# Patient Record
Sex: Female | Born: 1945 | Marital: Married | Smoking: Never smoker
Health system: Southern US, Community
[De-identification: ages and names within clinical notes are randomized; demographics above are authoritative.]

## PROBLEM LIST (undated history)

## (undated) DIAGNOSIS — G43909 Migraine, unspecified, not intractable, without status migrainosus: Secondary | ICD-10-CM

## (undated) DIAGNOSIS — M1990 Unspecified osteoarthritis, unspecified site: Secondary | ICD-10-CM

## (undated) DIAGNOSIS — K219 Gastro-esophageal reflux disease without esophagitis: Secondary | ICD-10-CM

## (undated) DIAGNOSIS — E079 Disorder of thyroid, unspecified: Secondary | ICD-10-CM

## (undated) DIAGNOSIS — H269 Unspecified cataract: Secondary | ICD-10-CM

## (undated) DIAGNOSIS — F329 Major depressive disorder, single episode, unspecified: Secondary | ICD-10-CM

## (undated) DIAGNOSIS — K5790 Diverticulosis of intestine, part unspecified, without perforation or abscess without bleeding: Secondary | ICD-10-CM

## (undated) DIAGNOSIS — K900 Celiac disease: Secondary | ICD-10-CM

## (undated) DIAGNOSIS — Z8739 Personal history of other diseases of the musculoskeletal system and connective tissue: Secondary | ICD-10-CM

## (undated) DIAGNOSIS — D126 Benign neoplasm of colon, unspecified: Secondary | ICD-10-CM

## (undated) DIAGNOSIS — J45909 Unspecified asthma, uncomplicated: Secondary | ICD-10-CM

## (undated) DIAGNOSIS — M8580 Other specified disorders of bone density and structure, unspecified site: Secondary | ICD-10-CM

## (undated) DIAGNOSIS — M797 Fibromyalgia: Secondary | ICD-10-CM

## (undated) DIAGNOSIS — F32A Depression, unspecified: Secondary | ICD-10-CM

## (undated) DIAGNOSIS — S82892A Other fracture of left lower leg, initial encounter for closed fracture: Secondary | ICD-10-CM

## (undated) DIAGNOSIS — M5430 Sciatica, unspecified side: Secondary | ICD-10-CM

## (undated) HISTORY — DX: Benign neoplasm of colon, unspecified: D12.6

## (undated) HISTORY — DX: Major depressive disorder, single episode, unspecified: F32.9

## (undated) HISTORY — DX: Other specified disorders of bone density and structure, unspecified site: M85.80

## (undated) HISTORY — PX: COLON SURGERY: SHX602

## (undated) HISTORY — DX: Gastro-esophageal reflux disease without esophagitis: K21.9

## (undated) HISTORY — DX: Personal history of other diseases of the musculoskeletal system and connective tissue: Z87.39

## (undated) HISTORY — PX: CATARACT EXTRACTION W/ INTRAOCULAR LENS IMPLANT: SHX1309

## (undated) HISTORY — DX: Unspecified cataract: H26.9

## (undated) HISTORY — PX: TUBAL LIGATION: SHX77

## (undated) HISTORY — PX: ABDOMINAL HYSTERECTOMY: SHX81

## (undated) HISTORY — PX: FOOT SURGERY: SHX648

## (undated) HISTORY — DX: Disorder of thyroid, unspecified: E07.9

## (undated) HISTORY — DX: Fibromyalgia: M79.7

## (undated) HISTORY — DX: Migraine, unspecified, not intractable, without status migrainosus: G43.909

## (undated) HISTORY — DX: Depression, unspecified: F32.A

## (undated) HISTORY — DX: Diverticulosis of intestine, part unspecified, without perforation or abscess without bleeding: K57.90

## (undated) HISTORY — PX: KNEE SURGERY: SHX244

## (undated) HISTORY — DX: Celiac disease: K90.0

## (undated) HISTORY — DX: Sciatica, unspecified side: M54.30

## (undated) HISTORY — DX: Unspecified asthma, uncomplicated: J45.909

## (undated) HISTORY — DX: Unspecified osteoarthritis, unspecified site: M19.90

## (undated) HISTORY — DX: Other fracture of left lower leg, initial encounter for closed fracture: S82.892A

---

## 1987-05-02 HISTORY — PX: BREAST EXCISIONAL BIOPSY: SUR124

## 2012-05-01 DIAGNOSIS — S82892A Other fracture of left lower leg, initial encounter for closed fracture: Secondary | ICD-10-CM

## 2012-05-01 HISTORY — DX: Other fracture of left lower leg, initial encounter for closed fracture: S82.892A

## 2012-08-08 DIAGNOSIS — K219 Gastro-esophageal reflux disease without esophagitis: Secondary | ICD-10-CM | POA: Insufficient documentation

## 2012-08-08 DIAGNOSIS — M5430 Sciatica, unspecified side: Secondary | ICD-10-CM | POA: Insufficient documentation

## 2012-08-08 DIAGNOSIS — K900 Celiac disease: Secondary | ICD-10-CM | POA: Insufficient documentation

## 2012-08-08 DIAGNOSIS — K5790 Diverticulosis of intestine, part unspecified, without perforation or abscess without bleeding: Secondary | ICD-10-CM | POA: Insufficient documentation

## 2012-08-08 DIAGNOSIS — J4540 Moderate persistent asthma, uncomplicated: Secondary | ICD-10-CM | POA: Insufficient documentation

## 2012-08-08 HISTORY — DX: Celiac disease: K90.0

## 2012-10-23 DIAGNOSIS — M1990 Unspecified osteoarthritis, unspecified site: Secondary | ICD-10-CM | POA: Insufficient documentation

## 2012-11-06 DIAGNOSIS — M5136 Other intervertebral disc degeneration, lumbar region: Secondary | ICD-10-CM | POA: Insufficient documentation

## 2012-12-01 ENCOUNTER — Other Ambulatory Visit: Payer: Self-pay | Admitting: Neurosurgery

## 2012-12-01 DIAGNOSIS — M549 Dorsalgia, unspecified: Secondary | ICD-10-CM

## 2012-12-05 ENCOUNTER — Ambulatory Visit
Admission: RE | Admit: 2012-12-05 | Discharge: 2012-12-05 | Disposition: A | Discharge status: Home / Self Care | Payer: Medicare Other | Source: Ambulatory Visit | Attending: Neurosurgery | Admitting: Neurosurgery

## 2012-12-05 VITALS — BP 103/60 | HR 59

## 2012-12-05 DIAGNOSIS — M549 Dorsalgia, unspecified: Secondary | ICD-10-CM

## 2012-12-05 MED ORDER — MEPERIDINE HCL 100 MG/ML IJ SOLN
75.0000 mg | Freq: Once | INTRAMUSCULAR | Status: AC
  Administered 2012-12-05: 75 mg via INTRAMUSCULAR

## 2012-12-05 MED ORDER — DIAZEPAM 5 MG PO TABS
10.0000 mg | ORAL_TABLET | Freq: Once | ORAL | Status: AC
  Administered 2012-12-05: 5 mg via ORAL

## 2012-12-05 MED ORDER — ONDANSETRON HCL 4 MG/2ML IJ SOLN
4.0000 mg | Freq: Once | INTRAMUSCULAR | Status: AC
  Administered 2012-12-05: 4 mg via INTRAMUSCULAR

## 2012-12-05 MED ORDER — IOHEXOL 180 MG/ML  SOLN
18.0000 mL | Freq: Once | INTRAMUSCULAR | Status: AC | PRN
  Administered 2012-12-05: 20 mL via INTRATHECAL

## 2012-12-05 NOTE — Progress Notes (Addendum)
Through husband translating, patient states she has been off Tramadol and Trazodone for the past two days.  Husband helped explain discharge instructions to patient.  Questions answered.  jkl

## 2013-06-23 ENCOUNTER — Other Ambulatory Visit: Payer: Self-pay | Admitting: Family Medicine

## 2013-06-23 DIAGNOSIS — M1990 Unspecified osteoarthritis, unspecified site: Secondary | ICD-10-CM

## 2013-06-23 DIAGNOSIS — Z1231 Encounter for screening mammogram for malignant neoplasm of breast: Secondary | ICD-10-CM

## 2013-06-24 DIAGNOSIS — E039 Hypothyroidism, unspecified: Secondary | ICD-10-CM | POA: Insufficient documentation

## 2013-07-10 ENCOUNTER — Ambulatory Visit
Admission: RE | Admit: 2013-07-10 | Discharge: 2013-07-10 | Disposition: A | Discharge status: Home / Self Care | Payer: Medicare HMO | Source: Ambulatory Visit | Attending: Family Medicine | Admitting: Family Medicine

## 2013-07-10 DIAGNOSIS — Z1231 Encounter for screening mammogram for malignant neoplasm of breast: Secondary | ICD-10-CM

## 2013-07-10 DIAGNOSIS — M1990 Unspecified osteoarthritis, unspecified site: Secondary | ICD-10-CM

## 2013-11-21 DIAGNOSIS — M8580 Other specified disorders of bone density and structure, unspecified site: Secondary | ICD-10-CM | POA: Insufficient documentation

## 2014-09-10 ENCOUNTER — Other Ambulatory Visit: Payer: Self-pay | Admitting: Family Medicine

## 2014-09-10 DIAGNOSIS — Z1231 Encounter for screening mammogram for malignant neoplasm of breast: Secondary | ICD-10-CM

## 2014-11-04 ENCOUNTER — Ambulatory Visit: Payer: Medicare HMO

## 2014-11-11 ENCOUNTER — Ambulatory Visit
Admission: RE | Admit: 2014-11-11 | Discharge: 2014-11-11 | Disposition: A | Discharge status: Home / Self Care | Payer: Medicare HMO | Source: Ambulatory Visit | Attending: Family Medicine | Admitting: Family Medicine

## 2014-11-11 DIAGNOSIS — Z1231 Encounter for screening mammogram for malignant neoplasm of breast: Secondary | ICD-10-CM

## 2014-11-24 ENCOUNTER — Other Ambulatory Visit: Payer: Self-pay | Admitting: Family Medicine

## 2014-11-24 DIAGNOSIS — N644 Mastodynia: Secondary | ICD-10-CM

## 2014-11-30 ENCOUNTER — Ambulatory Visit
Admission: RE | Admit: 2014-11-30 | Discharge: 2014-11-30 | Disposition: A | Discharge status: Home / Self Care | Payer: Medicare HMO | Source: Ambulatory Visit | Attending: Family Medicine | Admitting: Family Medicine

## 2014-11-30 DIAGNOSIS — N644 Mastodynia: Secondary | ICD-10-CM

## 2014-12-02 DIAGNOSIS — F339 Major depressive disorder, recurrent, unspecified: Secondary | ICD-10-CM | POA: Insufficient documentation

## 2014-12-02 DIAGNOSIS — M797 Fibromyalgia: Secondary | ICD-10-CM | POA: Insufficient documentation

## 2014-12-02 DIAGNOSIS — S82892A Other fracture of left lower leg, initial encounter for closed fracture: Secondary | ICD-10-CM | POA: Insufficient documentation

## 2015-05-06 ENCOUNTER — Other Ambulatory Visit: Payer: Self-pay | Admitting: Family Medicine

## 2015-05-06 DIAGNOSIS — N6310 Unspecified lump in the right breast, unspecified quadrant: Secondary | ICD-10-CM

## 2015-05-18 ENCOUNTER — Ambulatory Visit
Admission: RE | Admit: 2015-05-18 | Discharge: 2015-05-18 | Disposition: A | Discharge status: Home / Self Care | Payer: Medicare HMO | Source: Ambulatory Visit | Attending: Family Medicine | Admitting: Family Medicine

## 2015-05-18 DIAGNOSIS — N6310 Unspecified lump in the right breast, unspecified quadrant: Secondary | ICD-10-CM

## 2015-08-02 DIAGNOSIS — D126 Benign neoplasm of colon, unspecified: Secondary | ICD-10-CM

## 2015-08-02 HISTORY — DX: Benign neoplasm of colon, unspecified: D12.6

## 2015-10-05 LAB — HM HEPATITIS C SCREENING LAB: HM HEPATITIS C SCREENING: NEGATIVE

## 2015-11-01 ENCOUNTER — Other Ambulatory Visit: Payer: Self-pay | Admitting: Family Medicine

## 2015-11-01 DIAGNOSIS — Z1231 Encounter for screening mammogram for malignant neoplasm of breast: Secondary | ICD-10-CM

## 2015-11-12 ENCOUNTER — Other Ambulatory Visit: Payer: Self-pay | Admitting: Family Medicine

## 2015-11-12 DIAGNOSIS — Z8739 Personal history of other diseases of the musculoskeletal system and connective tissue: Secondary | ICD-10-CM | POA: Insufficient documentation

## 2015-11-12 DIAGNOSIS — M8580 Other specified disorders of bone density and structure, unspecified site: Secondary | ICD-10-CM

## 2015-11-16 ENCOUNTER — Ambulatory Visit: Payer: Medicare HMO

## 2015-11-26 ENCOUNTER — Other Ambulatory Visit: Payer: Medicare HMO

## 2015-11-26 ENCOUNTER — Ambulatory Visit: Payer: Medicare HMO

## 2015-12-01 ENCOUNTER — Ambulatory Visit: Payer: Medicare HMO

## 2015-12-01 ENCOUNTER — Other Ambulatory Visit: Payer: Medicare HMO

## 2015-12-13 ENCOUNTER — Ambulatory Visit
Admission: RE | Admit: 2015-12-13 | Discharge: 2015-12-13 | Disposition: A | Discharge status: Home / Self Care | Payer: Medicare HMO | Source: Ambulatory Visit | Attending: Family Medicine | Admitting: Family Medicine

## 2015-12-13 DIAGNOSIS — Z1231 Encounter for screening mammogram for malignant neoplasm of breast: Secondary | ICD-10-CM

## 2015-12-13 DIAGNOSIS — M8580 Other specified disorders of bone density and structure, unspecified site: Secondary | ICD-10-CM

## 2017-01-16 ENCOUNTER — Other Ambulatory Visit: Payer: Self-pay | Admitting: Family Medicine

## 2017-01-16 ENCOUNTER — Other Ambulatory Visit: Payer: Self-pay | Admitting: Obstetrics and Gynecology

## 2017-01-16 DIAGNOSIS — Z1231 Encounter for screening mammogram for malignant neoplasm of breast: Secondary | ICD-10-CM

## 2017-01-29 ENCOUNTER — Ambulatory Visit
Admission: RE | Admit: 2017-01-29 | Discharge: 2017-01-29 | Disposition: A | Discharge status: Home / Self Care | Payer: Medicare HMO | Source: Ambulatory Visit | Attending: Obstetrics and Gynecology | Admitting: Obstetrics and Gynecology

## 2017-01-29 DIAGNOSIS — Z1231 Encounter for screening mammogram for malignant neoplasm of breast: Secondary | ICD-10-CM

## 2017-04-04 ENCOUNTER — Encounter: Payer: Self-pay | Admitting: *Deleted

## 2017-04-04 ENCOUNTER — Other Ambulatory Visit: Payer: Self-pay | Admitting: *Deleted

## 2017-04-04 NOTE — Progress Notes (Addendum)
Subjective:   Allison Grant is a 71 y.o. female who presents for Medicare Annual (Subsequent) preventive examination.  [facility] Interpreter Service representative Vaughan Browner here as interpreter.   Review of Systems:  No ROS.  Medicare Wellness Visit. Additional risk factors are reflected in the social history.  Cardiac Risk Factors include: advanced age (>43mn, >>47women);family history of premature cardiovascular disease;sedentary lifestyle;obesity (BMI >30kg/m2)   Sleep patterns: Sleeps 6 hours. Struggles with pain getting out of bed.  Home Safety/Smoke Alarms: Feels safe in home. Smoke alarms in place.  Living environment; residence and Firearm Safety: Lives with husband and grandson in first floor apt.  Seat Belt Safety/Bike Helmet: Wears seat belt.   Female:   Pap-N/A       Mammo-01/29/2017, negative.      Dexa scan-12/13/2015, Osteopenia.    CCS-Colonoscopy 06/2015, recall 10 years.      Objective:     Vitals: BP 110/74 (BP Location: Right Arm, Patient Position: Sitting, Cuff Size: Large)   Pulse 72   Temp 98.1 F (36.7 C) (Oral)   Ht 5' 2"  (1.575 m)   Wt 155 lb 4 oz (70.4 kg)   SpO2 98%   BMI 28.40 kg/m   Body mass index is 28.4 kg/m.  Advanced Directives 04/05/2017  Does Patient Have a Medical Advance Directive? No  Would patient like information on creating a medical advance directive? Yes (MAU/Ambulatory/Procedural Areas - Information given)    Tobacco Social History   Tobacco Use  Smoking Status Never Smoker  Smokeless Tobacco Never Used     Counseling given: Not Answered     Past Medical History:  Diagnosis Date  . Arthritis   . Asthma   . Cataracts, bilateral   . 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 Depression   . Disease of thyroid gland   . Diverticulosis   . Fibromyalgia   . Fracture of left ankle 05/2012  . GERD (gastroesophageal reflux disease)   .  Migraine   . Osteopenia   . Personal history of calcium pyrophosphate deposition disease (CPPD)   . Sciatica    Past Surgical History:  Procedure Laterality Date  . ABDOMINAL HYSTERECTOMY    . BREAST EXCISIONAL BIOPSY Left 1989   benign  . CATARACT EXTRACTION W/ INTRAOCULAR LENS IMPLANT Left   . FOOT SURGERY    . TUBAL LIGATION     Family History  Problem Relation Age of Onset  . Arthritis Mother   . Asthma Mother   . Heart disease Mother   . Hypertension Mother   . Mental illness Mother   . Thyroid disease Mother   . Emphysema Father   . Hyperlipidemia Father   . Asthma Sister   . Seizures Sister   . Cancer Paternal Grandmother   . Diabetes Paternal Grandmother   . Hypertension Paternal Grandmother    Social History   Socioeconomic History  . Marital status: Married    Spouse name: None  . Number of children: None  . Years of education: None  . Highest education level: None  Social Needs  . Financial resource strain: None  . Food insecurity - worry: None  . Food insecurity - inability: None  . Transportation needs - medical: None  . Transportation needs - non-medical: None  Occupational History  . None  Tobacco Use  . Smoking status: Never Smoker  . Smokeless tobacco: Never  Used  Substance and Sexual Activity  . Alcohol use: No    Frequency: Never  . Drug use: No  . Sexual activity: No  Other Topics Concern  . None  Social History Narrative  . None    Outpatient Encounter Medications as of 04/05/2017  Medication Sig  . Acetaminophen 500 MG coapsule   . clonazePAM (KLONOPIN) 1 MG tablet TAKE 1 TABLET TWICE DAILY AS NEEDED FOR ANXIETY  . dicyclomine (BENTYL) 10 MG capsule Take by mouth.  . lansoprazole (PREVACID) 30 MG capsule Take by mouth.  . levothyroxine (SYNTHROID, LEVOTHROID) 25 MCG tablet Take by mouth.  . oxyCODONE-acetaminophen (PERCOCET/ROXICET) 5-325 MG tablet Take 1 tablet by mouth daily as needed for severe pain.  . Polyethylene Glycol  3350 GRAN by Does not apply route.  . traZODone (DESYREL) 100 MG tablet TAKE 2 TABLETS AT BEDTIME  . [DISCONTINUED] clonazePAM (KLONOPIN) 1 MG tablet TAKE 1 TABLET TWICE DAILY AS NEEDED FOR ANXIETY  . [DISCONTINUED] oxyCODONE-acetaminophen (PERCOCET/ROXICET) 5-325 MG tablet Take by mouth.  . [DISCONTINUED] montelukast (SINGULAIR) 10 MG tablet Take by mouth.  . [DISCONTINUED] pregabalin (LYRICA) 150 MG capsule Take by mouth.  . [DISCONTINUED] traMADol-acetaminophen (ULTRACET) 37.5-325 MG tablet Take by mouth.  . [DISCONTINUED] venlafaxine XR (EFFEXOR-XR) 75 MG 24 hr capsule TAKE ONE CAPSULE BY MOUTH EVERY MORNING WITH BREAKFAST   No facility-administered encounter medications on file as of 04/05/2017.       Patient Care Team: Leamon Arnt, MD as PCP - General (Family Medicine) [place], [place] (Specialist)    Assessment:    Physical assessment deferred to PCP.  Exercise Activities and Dietary recommendations Current Exercise Habits: The patient does not participate in regular exercise at present, Exercise limited by: orthopedic condition(s)   Diet (meal preparation, eat out, water intake, caffeinated beverages, dairy products, fruits and vegetables): Drinks water and juice.   Breakfast: coffee only Eats plenty of fruits and vegetables.   Goals    None     Fall Risk Fall Risk  04/05/2017 04/05/2017  Falls in the past year? No No    Depression Screen PHQ 2/9 Scores 04/05/2017  PHQ - 2 Score 0  PHQ- 9 Score 4     Cognitive Function       Ad8 score reviewed for issues:  Issues making decisions: no  Less interest in hobbies / activities: no  Repeats questions, stories (family complaining): no  Trouble using ordinary gadgets (microwave, computer, phone): no  Forgets the month or year: no  Mismanaging finances: no  Remembering appts: no  Daily problems with thinking and/or memory: no Ad8 score is=0     Immunization History  Administered  Date(s) Administered  . Influenza, High Dose Seasonal PF 01/28/2014, 05/11/2015, 03/31/2016, 01/30/2017  . Influenza, Quadrivalent, Recombinant, Inj, Pf 03/24/2013  . Pneumococcal Conjugate-13 09/10/2014  . Pneumococcal Polysaccharide-23 08/08/2012  . Zoster 09/10/2014   Screening Tests Health Maintenance  Topic Date Due  . TETANUS/TDAP  04/05/2018 (Originally 04/11/1965)  . PNA vac Low Risk Adult (2 of 2 - PPSV23) 08/08/2017  . MAMMOGRAM  01/29/2018  . COLONOSCOPY  07/28/2025  . INFLUENZA VACCINE  Completed  . DEXA SCAN  Completed  . Hepatitis C Screening  Completed       Plan:    Bring a copy of your living will and/or healthcare power of attorney to your next office visit.  Continue doing brain stimulating activities (puzzles, reading, adult coloring books, staying active) to keep memory sharp.   I have personally  reviewed and noted the following in the patient's chart:   . Medical and social history . Use of alcohol, tobacco or illicit drugs  . Current medications and supplements . Functional ability and status . Nutritional status . Physical activity . Advanced directives . List of other physicians . Hospitalizations, surgeries, and ER visits in previous 12 months . Vitals . Screenings to include cognitive, depression, and falls . Referrals and appointments  In addition, I have reviewed and discussed with patient certain preventive protocols, quality metrics, and best practice recommendations. A written personalized care plan for preventive services as well as general preventive health recommendations were provided to patient.     Gerilyn Nestle, RN  04/05/2017   I have reviewed the above documentation regarding the annual wellness visit and agree with the documentation, impression and recommendations.  Billey Chang, MD

## 2017-04-05 ENCOUNTER — Encounter: Payer: Self-pay | Admitting: *Deleted

## 2017-04-05 ENCOUNTER — Encounter: Payer: Self-pay | Admitting: Family Medicine

## 2017-04-05 ENCOUNTER — Other Ambulatory Visit: Payer: Self-pay | Admitting: *Deleted

## 2017-04-05 ENCOUNTER — Ambulatory Visit: Payer: Medicare HMO

## 2017-04-05 ENCOUNTER — Ambulatory Visit (INDEPENDENT_AMBULATORY_CARE_PROVIDER_SITE_OTHER): Payer: Medicare HMO | Admitting: Family Medicine

## 2017-04-05 VITALS — BP 110/74 | HR 72 | Temp 98.1°F | Ht 62.0 in | Wt 155.2 lb

## 2017-04-05 DIAGNOSIS — F339 Major depressive disorder, recurrent, unspecified: Secondary | ICD-10-CM

## 2017-04-05 DIAGNOSIS — M797 Fibromyalgia: Secondary | ICD-10-CM | POA: Diagnosis not present

## 2017-04-05 DIAGNOSIS — M5136 Other intervertebral disc degeneration, lumbar region: Secondary | ICD-10-CM

## 2017-04-05 DIAGNOSIS — Z0000 Encounter for general adult medical examination without abnormal findings: Secondary | ICD-10-CM | POA: Diagnosis not present

## 2017-04-05 DIAGNOSIS — E039 Hypothyroidism, unspecified: Secondary | ICD-10-CM | POA: Diagnosis not present

## 2017-04-05 DIAGNOSIS — F1190 Opioid use, unspecified, uncomplicated: Secondary | ICD-10-CM | POA: Diagnosis not present

## 2017-04-05 DIAGNOSIS — K219 Gastro-esophageal reflux disease without esophagitis: Secondary | ICD-10-CM

## 2017-04-05 DIAGNOSIS — G894 Chronic pain syndrome: Secondary | ICD-10-CM

## 2017-04-05 LAB — LIPID PANEL
CHOL/HDL RATIO: 4
Cholesterol: 240 mg/dL — ABNORMAL HIGH (ref 0–200)
HDL: 61.9 mg/dL (ref >39.00)
LDL Cholesterol: 160 mg/dL — ABNORMAL HIGH (ref 0–99)
NonHDL: 178.22
TRIGLYCERIDES: 92 mg/dL (ref 0.0–149.0)
VLDL: 18.4 mg/dL (ref 0.0–40.0)

## 2017-04-05 LAB — CBC WITH DIFFERENTIAL/PLATELET
BASOS ABS: 0 10*3/uL (ref 0.0–0.1)
BASOS PCT: 0.5 % (ref 0.0–3.0)
EOS PCT: 1.6 % (ref 0.0–5.0)
Eosinophils Absolute: 0.1 10*3/uL (ref 0.0–0.7)
HEMATOCRIT: 42.7 % (ref 36.0–46.0)
Hemoglobin: 14 g/dL (ref 12.0–15.0)
LYMPHS PCT: 25.5 % (ref 12.0–46.0)
Lymphs Abs: 1.5 10*3/uL (ref 0.7–4.0)
MCHC: 32.8 g/dL (ref 30.0–36.0)
MCV: 90.7 fl (ref 78.0–100.0)
MONOS PCT: 8 % (ref 3.0–12.0)
Monocytes Absolute: 0.5 10*3/uL (ref 0.1–1.0)
NEUTROS ABS: 3.7 10*3/uL (ref 1.4–7.7)
Neutrophils Relative %: 64.4 % (ref 43.0–77.0)
PLATELETS: 264 10*3/uL (ref 150.0–400.0)
RBC: 4.71 Mil/uL (ref 3.87–5.11)
RDW: 14 % (ref 11.5–15.5)
WBC: 5.8 10*3/uL (ref 4.0–10.5)

## 2017-04-05 LAB — COMPREHENSIVE METABOLIC PANEL
ALT: 11 U/L (ref 0–35)
AST: 15 U/L (ref 0–37)
Albumin: 4.2 g/dL (ref 3.5–5.2)
Alkaline Phosphatase: 68 U/L (ref 39–117)
BILIRUBIN TOTAL: 0.3 mg/dL (ref 0.2–1.2)
BUN: 11 mg/dL (ref 6–23)
CALCIUM: 9.2 mg/dL (ref 8.4–10.5)
CO2: 29 meq/L (ref 19–32)
Chloride: 103 mEq/L (ref 96–112)
Creatinine, Ser: 0.69 mg/dL (ref 0.40–1.20)
GFR: 89.66 mL/min (ref >60.00)
Glucose, Bld: 78 mg/dL (ref 70–99)
Potassium: 4.7 mEq/L (ref 3.5–5.1)
Sodium: 140 mEq/L (ref 135–145)
Total Protein: 6.7 g/dL (ref 6.0–8.3)

## 2017-04-05 LAB — TSH: TSH: 1.23 u[IU]/mL (ref 0.35–4.50)

## 2017-04-05 MED ORDER — CLONAZEPAM 1 MG PO TABS: ORAL_TABLET | ORAL | 0 refills | Status: DC

## 2017-04-05 MED ORDER — OXYCODONE-ACETAMINOPHEN 5-325 MG PO TABS: 1.0000 | ORAL_TABLET | Freq: Every day | ORAL | 0 refills | Status: DC | PRN

## 2017-04-05 NOTE — Patient Instructions (Addendum)
It was so good seeing you again! Thank you for establishing with my new practice and allowing me to continue caring for you. It means a lot to me.   Please schedule a follow up appointment with me in 4 weeks to discuss pain medications.  Please do these things to maintain good health!   Exercise at least 30-45 minutes a day,  4-5 days a week.   Eat a low-fat diet with lots of fruits and vegetables, up to 7-9 servings per day.  Drink plenty of water daily. Try to drink 8 8oz glasses per day.  Seatbelts can save your life. Always wear your seatbelt.  Place Smoke Detectors on every level of your home and check batteries every year.  Schedule an appointment with an eye doctor for an eye exam every 1-2 years  Safe sex - use condoms to protect yourself from STDs if you could be exposed to these types of infections. Use birth control if you do not want to become pregnant and are sexually active.  Avoid heavy alcohol use. If you drink, keep it to less than 2 drinks/day and not every day.  Health Care Power of Attorney.  Choose someone you trust that could speak for you if you became unable to speak for yourself.  Depression is common in our stressful world.If you're feeling down or losing interest in things you normally enjoy, please come in for a visit.  If anyone is threatening or hurting you, please get help. Physical or Emotional Violence is never OK.   Bring a copy of your living will and/or healthcare power of attorney to your next office visit.  Continue doing brain stimulating activities (puzzles, reading, adult coloring books, staying active) to keep memory sharp.   Mantenimiento de la salud - Mujeres (Health Maintenance, Female) Un estilo de vida saludable y los cuidados preventivos pueden favorecer considerablemente a la salud y el bienestar. Pregunte a su mdico cul es el cronograma de exmenes peridicos apropiado para usted. Esta es una buena oportunidad para consultarlo  sobre cmo prevenir enfermedades y mantenerse sano. Adems de los controles, hay muchas otras cosas que puede hacer usted mismo. Los expertos han realizado numerosas investigaciones sobre los cambios en el estilo de vida y las medidas de prevencin que, muy probablemente, lo ayudarn a mantenerse sano. Solicite a su mdico ms informacin. EL PESO Y LA DIETA Consuma una dieta saludable.  Asegrese de incluir muchas verduras, frutas, productos lcteos de bajo contenido de grasa y protenas magras.  No consuma muchos alimentos de alto contenido de grasas slidas, azcares agregados o sal.  Realice actividad fsica con regularidad. Esta es una de las prcticas ms importantes que puede hacer por su salud. ? La mayora de los adultos deben hacer ejercicio durante al menos 150minutos por semana. El ejercicio debe aumentar la frecuencia cardaca y provocar la transpiracin (ejercicio de intensidad moderada). ? La mayora de los adultos tambin deben hacer ejercicios de elongacin al menos dos veces a la semana. Agregue esto al su plan de ejercicio de intensidad moderada. Mantenga un peso saludable.  El ndice de masa corporal (IMC) es una medida que puede utilizarse para identificar posibles problemas de peso. Proporciona una estimacin de la grasa corporal basndose en el peso y la altura. Su mdico puede ayudarle a determinar su IMC y a lograr o mantener un peso saludable.  Para las mujeres de 20aos o ms: ? Un IMC menor de 18,5 se considera bajo peso. ? Un IMC entre 18,5 y   24,9 es normal. ? Un IMC entre 25 y 29,9 se considera sobrepeso. ? Un IMC de 30 o ms se considera obesidad. Observe los niveles de colesterol y lpidos en la sangre.  Debe comenzar a realizarse anlisis de lpidos y colesterol en la sangre a los 20aos y luego repetirlos cada 5aos.  Es posible que necesite controlar los niveles de colesterol con mayor frecuencia si: ? Sus niveles de lpidos y colesterol son  altos. ? Es mayor de 50aos. ? Presenta un alto riesgo de padecer enfermedades cardacas. DETECCIN DE CNCER Cncer de pulmn  Se recomienda realizar exmenes de deteccin de cncer de pulmn a personas adultas entre 55 y 80 aos que estn en riesgo de desarrollar cncer de pulmn por sus antecedentes de consumo de tabaco.  Se recomienda una tomografa computarizada de baja dosis de los pulmones todos los aos a las personas que: ? Fuman actualmente. ? Hayan dejado el hbito en algn momento en los ltimos 15aos. ? Hayan fumado durante 30aos un paquete diario. Un paquete-ao equivale a fumar un promedio de un paquete de cigarrillos diario durante un ao.  Los exmenes de deteccin anuales deben continuar hasta que hayan pasado 15aos desde que dej de fumar.  Ya no debern realizarse si tiene un problema de salud que le impida recibir tratamiento para el cncer de pulmn. Cncer de mama  Practique la autoconciencia de la mama. Esto significa reconocer la apariencia normal de sus mamas y cmo las siente.  Tambin significa realizar autoexmenes regulares de las mamas. Informe a su mdico sobre cualquier cambio, sin importar cun pequeo sea.  Si tiene entre 20 y 30 aos, un mdico debe realizarle un examen clnico de las mamas como parte del examen regular de salud, cada 1 a 3aos.  Si tiene 40aos o ms, debe realizarse un examen clnico de las mamas todos los aos. Tambin considere realizarse una radiografa de las mamas (mamografa) todos los aos.  Si tiene antecedentes familiares de cncer de mama, hable con su mdico para someterse a un estudio gentico.  Si tiene alto riesgo de padecer cncer de mama, hable con su mdico para someterse a una resonancia magntica y una mamografa todos los aos.  La evaluacin del gen del cncer de mama (BRCA) se recomienda a mujeres que tengan familiares con cnceres relacionados con el BRCA. Los cnceres relacionados con el BRCA  incluyen los siguientes: ? Mama. ? Ovario. ? Trompas. ? Cnceres de peritoneo.  Los resultados de la evaluacin determinarn la necesidad de asesoramiento gentico y de anlisis de BRCA1 y BRCA2. Cncer de cuello del tero El mdico puede recomendarle que se haga pruebas peridicas de deteccin de cncer de los rganos de la pelvis (ovarios, tero y vagina). Estas pruebas incluyen un examen plvico, que abarca controlar si se produjeron cambios microscpicos en la superficie del cuello del tero (prueba de Papanicolaou). Pueden recomendarle que se haga estas pruebas cada 3aos, a partir de los 21aos.  A las mujeres que tienen entre 30 y 65aos, los mdicos pueden recomendarles que se sometan a exmenes plvicos y pruebas de Papanicolaou cada 3aos, o a la prueba de Papanicolaou y el examen plvico en combinacin con estudios de deteccin del virus del papiloma humano (VPH) cada 5aos. Algunos tipos de VPH aumentan el riesgo de padecer cncer de cuello del tero. La prueba para la deteccin del VPH tambin puede realizarse a mujeres de cualquier edad cuyos resultados de la prueba de Papanicolaou no sean claros.  Es posible que otros mdicos   no recomienden exmenes de deteccin a mujeres no embarazadas que se consideran sujetos de bajo riesgo de Chief Financial Officer de pelvis y que no tienen sntomas. Pregntele al mdico si un examen plvico de deteccin es adecuado para usted.  Si ha recibido un tratamiento para Science writer cervical o una enfermedad que podra causar cncer, necesitar realizarse una prueba de Papanicolaou y controles durante al menos 71 aos de concluido el [place]. Si no se ha hecho el Papanicolaou con regularidad, debern volver a evaluarse los factores de riesgo (como tener un nuevo compaero sexual), para Teacher, adult education si debe realizarse los estudios nuevamente. Algunas mujeres sufren problemas mdicos que aumentan la probabilidad de Museum/gallery curator cncer de cuello del tero. En estos  casos, el mdico podr [employer] se realicen controles y pruebas de Papanicolaou con ms frecuencia. Cncer colorrectal  Este tipo de cncer puede detectarse y a menudo prevenirse.  Por lo general, los estudios de rutina se deben Medical laboratory scientific officer a Field seismologist a Proofreader de los 83 aos y [place] 34 aos.  Sin embargo, el mdico podr aconsejarle que lo haga antes, si tiene factores de riesgo para el cncer de colon.  Tambin puede recomendarle que use un kit de prueba para Hydrologist en la materia fecal.  Es posible que se use una pequea cmara en el extremo de un tubo para examinar directamente el colon (sigmoidoscopia o colonoscopia) a fin de Hydrographic surveyor formas tempranas de cncer colorrectal.  Los exmenes de rutina generalmente comienzan a los 52aos.  El examen directo del colon se debe repetir cada 5 a 10aos hasta los 75aos. Sin embargo, es posible que se realicen exmenes con mayor frecuencia, si se detectan formas tempranas de plipos precancerosos o pequeos bultos. Cncer de piel  Revise la piel de la cabeza a los pies con regularidad.  Informe a su mdico si aparecen nuevos lunares o los que tiene se modifican, especialmente en su forma y color.  Tambin notifique al mdico si tiene un lunar que es ms grande que el tamao de una goma de lpiz.  Siempre use pantalla solar. Aplique pantalla solar de [place] y repetida a lo largo del Training and development officer.  Protjase usando mangas y [employer], un sombrero de ala ancha y gafas para el sol, siempre que se encuentre en el exterior. ENFERMEDADES CARDACAS, DIABETES E HIPERTENSIN ARTERIAL  La hipertensin arterial causa enfermedades cardacas y [place] el riesgo de ictus. La hipertensin arterial es ms probable en los siguientes casos: ? Las personas que tienen la presin arterial en el extremo del rango normal (100-139/85-89 mm Hg). ? [employer] con sobrepeso u obesidad. ? Scientist, water quality.  Si usted tiene entre 18 y  39 aos, debe medirse la presin arterial cada 3 a 5 aos. Si usted tiene 40 aos o ms, debe medirse la presin arterial [employer]. Debe medirse la presin arterial dos veces: una vez cuando est en un hospital o una clnica y la otra vez cuando est en otro sitio. Registre el promedio de [employer]. Para controlar su presin arterial cuando no est en un hospital o [place], puede usar lo siguiente: ? Jorje Guild automtica para medir la presin arterial en una farmacia. ? Un monitor para medir la presin arterial en el hogar.  Si tiene entre 62 y 91 aos, consulte a su mdico si debe tomar aspirina para prevenir el ictus.  Realcese exmenes de deteccin de la diabetes con regularidad. Esto incluye la toma de [place] de [place] para  controlar el nivel de azcar en la sangre durante el ayuno. ? Si tiene un peso normal y un bajo riesgo de padecer diabetes, realcese este anlisis cada tres aos despus de los 45aos. ? Si tiene sobrepeso y un alto riesgo de padecer diabetes, considere someterse a este anlisis antes o con mayor frecuencia. PREVENCIN DE INFECCIONES HepatitisB  Si tiene un riesgo ms alto de contraer hepatitis B, debe someterse a un examen de deteccin de este virus. Se considera que tiene un alto riesgo de contraer hepatitis B si: ? Naci en un pas donde la hepatitis B es frecuente. Pregntele a su mdico qu pases son considerados de alto riesgo. ? Sus padres nacieron en un pas de alto riesgo y usted no recibi una vacuna que lo proteja contra la hepatitis B (vacuna contra la hepatitis B). ? Tiene VIH o sida. ? Usa agujas para inyectarse drogas. ? Vive con alguien que tiene hepatitis B. ? Ha tenido sexo con alguien que tiene hepatitis B. ? Recibe tratamiento de hemodilisis. ? Toma ciertos medicamentos para el cncer, trasplante de rganos y afecciones autoinmunitarias. Hepatitis C  Se recomienda un anlisis de sangre para: ? Todos los que nacieron  entre 1945 y 1965. ? Todas las personas que tengan un riesgo de haber contrado hepatitis C. Enfermedades de transmisin sexual (ETS).  Debe realizarse pruebas de deteccin de enfermedades de transmisin sexual (ETS), incluidas gonorrea y clamidia si: ? Es sexualmente activo y es menor de 24aos. ? Es mayor de 24aos, y el mdico le informa que corre riesgo de tener este tipo de infecciones. ? La actividad sexual ha cambiado desde que le hicieron la ltima prueba de deteccin y tiene un riesgo mayor de tener clamidia o gonorrea. Pregntele al mdico si usted tiene riesgo.  Si no tiene el VIH, pero corre riesgo de infectarse por el virus, se recomienda tomar diariamente un medicamento recetado para evitar la infeccin. Esto se conoce como profilaxis previa a la exposicin. Se considera que est en riesgo si: ? Es activo sexualmente y no usa preservativos habitualmente o no conoce el estado del VIH de sus parejas sexuales. ? Se inyecta drogas. ? Es activo sexualmente con una pareja que tiene VIH. Consulte a su mdico para saber si tiene un alto riesgo de infectarse por el VIH. Si opta por comenzar la profilaxis previa a la exposicin, primero debe realizarse anlisis de deteccin del VIH. Luego, le harn anlisis cada 3meses mientras est tomando los medicamentos para la profilaxis previa a la exposicin. EMBARAZO  Si es premenopusica y puede quedar embarazada, solicite a su mdico asesoramiento previo a la concepcin.  Si puede quedar embarazada, tome 400 a 800microgramos (mcg) de cido flico todos los das.  Si desea evitar el embarazo, hable con su mdico sobre el control de la natalidad (anticoncepcin). OSTEOPOROSIS Y MENOPAUSIA  La osteoporosis es una enfermedad en la que los huesos pierden los minerales y la fuerza por el avance de la edad. El resultado pueden ser fracturas graves en los huesos. El riesgo de osteoporosis puede identificarse con una prueba de densidad sea.  Si  tiene 65aos o ms, o si est en riesgo de sufrir osteoporosis y fracturas, pregunte a su mdico si debe someterse a exmenes.  Consulte a su mdico si debe tomar un suplemento de calcio o de vitamina D para reducir el riesgo de osteoporosis.  La menopausia puede presentar ciertos sntomas fsicos y riesgos.  La terapia de reemplazo hormonal puede reducir algunos de estos sntomas y   riesgos. Consulte a su mdico para saber si la terapia de reemplazo hormonal es conveniente para usted. INSTRUCCIONES PARA EL CUIDADO EN EL HOGAR  Realcese los estudios de rutina de la salud, dentales y de Public librarian.  Soldier.  No consuma ningn producto que contenga tabaco, lo que incluye cigarrillos, tabaco de Higher education careers adviser o Psychologist, sport and exercise.  Si est embarazada, no beba alcohol.  Si est amamantando, reduzca el consumo de alcohol y la frecuencia con la que consume.  Si es mujer y no est embarazada limite el consumo de alcohol a no ms de 1 medida por da. Una medida equivale a 12onzas de cerveza, 5onzas de vino o 1onzas de bebidas alcohlicas de alta graduacin.  No consuma drogas.  No comparta agujas.  Solicite ayuda a su mdico si necesita apoyo o informacin para abandonar las drogas.  Informe a su mdico si a menudo se siente deprimido.  Notifique a su mdico si alguna vez ha sido vctima de abuso o si no se siente seguro en su hogar. Esta informacin no tiene Marine scientist el consejo del mdico. Asegrese de hacerle al mdico cualquier pregunta que tenga. Document Released: 04/06/2011 Document Revised: 05/08/2014 Document Reviewed: 01/19/2015 Elsevier Interactive Patient Education  [employer].

## 2017-04-05 NOTE — Progress Notes (Addendum)
Subjective  Chief Complaint  Patient presents with  . Annual Exam    [facility] Medicare  . Medicare Wellness    HPI: Allison Grant is a 71 y.o. female who presents to [place] at [facility] today for a Female Wellness Visit. She also has the concerns and/or needs as listed above in the chief complaint. These will be addressed in addition to the Health Maintenance Visit.   Wellness Visit: annual visit with health maintenance review and exam without Pap   Former [place] patient and records reviewed and updated. HM is up to date.  Patient reports that she is much improved since I last saw her.  In March of this year she took custody of her 15-1/2-year-old grandson.  He is changed her life.  She now has a reason to live.  She is much more active, she even traveled to [place] with him.  She goes outside, walks daily and is enjoying life much more  However her depression and chronic pain problems persist.  I reviewed recent notes from neurology. "The pain is located in the neck, shoulders, bilateral hips, bilateral knees, and mid lower back area and does radiate to the mid-spine, arm and knees, consistant with fibromyalgia. The pain started 2000 following work related injury and symptoms have been worsening. The patient denies associated bowel or bladder incontinence, saddle anesthesia, or upper or lower extremity weakness. Prior therapies attempted include chiropractic manipulation, cold compresses, heat, massage therapy, narcotic analgesics including oxycodone/acetaminophen (Percocet, Tylox) and tramadol (Ultram), neuropathic pain medications including gabapentin (Neurontin, Gralise) and pregabalin (Lyrica), physical therapy and epidural, facet block, nerve block, psychotherapy ".  She received what sounds like nerve blocks recently however they did not help.  She has a low opioid abuse risk score.  She is using Percocet twice a day.  She was unable to obtain the Lyrica but does report  that this is helped her in the past.  She also uses Klonopin at night and trazodone to help her sleep.  This is partly due to insomnia but partly related to her pain syndrome.  She also has trouble sleeping due to her chronic depression for which she has seen a psychiatrist.  She is requesting refills of Klonopin and Percocet.  This specialist agrees that narcotics are not indicated for fibromyalgia but she does have a significant amount of osteoarthritis it is not been helped by other means.  We discussed managing chronic pain.  She has been to a chronic pain specialist in the past and refuses to go back.  Chronic abdominal pain with hematochezia recently evaluated by ER and GI.  This is improved.  CT scan was consistent with colitis.  Most recent colonoscopy was March 2017.  No history of IBD.  Treated with prednisone and is improving.  She has follow-up scheduled with GI and they will repeat a CT scan to ensure resolution of symptoms.  Lifestyle: Body mass index is 28.4 kg/m. Wt Readings from Last 3 Encounters:  04/05/17 155 lb 4 oz (70.4 kg)   Diet: general Exercise: intermittently, walking  Chronic disease management visit and/or acute problem visit:  See above  Patient Active Problem List   Diagnosis Date Noted  . Colon adenoma 08/02/2015    Priority: High  . Fibromyalgia 12/02/2014    Priority: High  . Chronic recurrent major depressive disorder ([place]) 12/02/2014    Priority: High  . Hypothyroidism 06/24/2013    Priority: High  . DDD (degenerative disc disease), lumbar 11/06/2012  Priority: High  . Personal history of calcium pyrophosphate deposition disease (CPPD) 11/12/2015    Priority: Medium  . Osteopenia 11/21/2013    Priority: Medium  . OA (osteoarthritis) 10/23/2012    Priority: Medium  . Celiac sprue 08/08/2012    Priority: Medium  . GERD (gastroesophageal reflux disease) 08/08/2012    Priority: Medium  . Moderate persistent asthma without complication  63/14/9702    Priority: Medium  . Diverticulosis 08/08/2012    Priority: Low  . Sciatica 08/08/2012   Health Maintenance  Topic Date Due  . TETANUS/TDAP  04/05/2018 (Originally 04/11/1965)  . PNA vac Low Risk Adult (2 of 2 - PPSV23) 08/08/2017  . MAMMOGRAM  01/29/2018  . COLONOSCOPY  07/28/2025  . INFLUENZA VACCINE  Completed  . DEXA SCAN  Completed  . Hepatitis C Screening  Completed   Immunization History  Administered Date(s) Administered  . Influenza, High Dose Seasonal PF 01/28/2014, 05/11/2015, 03/31/2016, 01/30/2017  . Influenza, Quadrivalent, Recombinant, Inj, Pf 03/24/2013  . Pneumococcal Conjugate-13 09/10/2014  . Pneumococcal Polysaccharide-23 08/08/2012  . Zoster 09/10/2014   We updated and reviewed the patient's past history in detail and it is documented below. Allergies: Patient is allergic to celebrex [celecoxib]. Past Medical History Patient  has a past medical history of Arthritis, Asthma, Cataracts, bilateral, Celiac sprue (08/08/2012), Colon adenoma (08/02/2015), Depression, Disease of thyroid gland, Diverticulosis, Fibromyalgia, Fracture of left ankle (05/2012), GERD (gastroesophageal reflux disease), Migraine, Osteopenia, Personal history of calcium pyrophosphate deposition disease (CPPD), and Sciatica. Past Surgical History Patient  has a past surgical history that includes Breast excisional biopsy (Left, 1989); Tubal ligation; Cataract extraction w/ intraocular lens implant (Left); Foot surgery; and Abdominal hysterectomy. Family History: Patient family history includes Arthritis in her mother; Asthma in her mother and sister; Cancer in her paternal grandmother; Diabetes in her paternal grandmother; Emphysema in her father; Heart disease in her mother; Hyperlipidemia in her father; Hypertension in her mother and paternal grandmother; Mental illness in her mother; Seizures in her sister; Thyroid disease in her mother. Social History:  Patient  reports that   has never smoked. she has never used smokeless tobacco. She reports that she does not drink alcohol or use drugs.  Review of Systems: Constitutional: negative for fever or malaise Ophthalmic: negative for photophobia, double vision or loss of vision Cardiovascular: negative for chest pain, dyspnea on exertion, or new LE swelling Respiratory: negative for SOB or persistent cough Gastrointestinal: negative for abdominal pain, change in bowel habits or melena Genitourinary: negative for dysuria or gross hematuria, no abnormal uterine bleeding or disharge Musculoskeletal: negative for new gait disturbance or muscular weakness Integumentary: negative for new or persistent rashes, no breast lumps Neurological: negative for TIA or stroke symptoms Psychiatric: negative for SI or delusions Allergic/Immunologic: negative for hives  Patient Care Team    Relationship Specialty Notifications Start End  Leamon Arnt, MD PCP - General Family Medicine  04/05/17     Objective  Vitals: BP 110/74 (BP Location: Right Arm, Patient Position: Sitting, Cuff Size: Large)   Pulse 72   Temp 98.1 F (36.7 C) (Oral)   Ht 5' 2"  (1.575 m)   Wt 155 lb 4 oz (70.4 kg)   SpO2 98%   BMI 28.40 kg/m  General:  Well developed, well nourished, no acute distress  Psych:  Alert and orientedx3,normal mood and affect HEENT:  Normocephalic, atraumatic, non-icteric sclera, PERRL, oropharynx is clear without mass or exudate, supple neck without adenopathy, mass or thyromegaly Cardiovascular:  Normal S1,  S2, RRR without gallop, rub or murmur, nondisplaced PMI Respiratory:  Good breath sounds bilaterally, CTAB with normal respiratory effort Gastrointestinal: normal bowel sounds, soft, non-tender, no noted masses. No HSM MSK: no deformities, contusions. Joints are without erythema or swelling. Spine and CVA region are nontender Skin:  Warm, no rashes or suspicious lesions noted Neurologic:    Mental status is normal. CN 2-11  are normal. Gross motor and sensory exams are normal. Normal gait. No tremor Breast Exam: No mass, skin retraction or nipple discharge is appreciated in either breast. No axillary adenopathy. Fibrocystic changes are not noted  Assessment  1. Annual physical exam   2. Fibromyalgia   3. Chronic recurrent major depressive disorder ([place])   4. Gastroesophageal reflux disease without esophagitis   5. Acquired hypothyroidism   6. DDD (degenerative disc disease), lumbar   7. Chronic narcotic use   8. Chronic pain syndrome      Plan  Female Wellness Visit:  Age appropriate Health Maintenance and Prevention measures were discussed with patient. Included topics are cancer screening recommendations, ways to keep healthy (see AVS) including dietary and exercise recommendations, regular eye and dental care, use of seat belts, and avoidance of moderate alcohol use and tobacco use.   BMI: discussed patient's BMI and encouraged positive lifestyle modifications to help get to or maintain a target BMI.  HM needs and immunizations were addressed and ordered. See below for orders. See HM and immunization section for updates.  Routine labs and screening tests ordered including cmp, cbc and lipids where appropriate.  Discussed recommendations regarding Vit D and calcium supplementation (see AVS)  Chronic disease f/u and/or acute problem visit: (deemed necessary to be done in addition to the wellness visit):  We had a long discussion with the help of the interpreter regarding chronic pain management.  Refill medications for 1 month and she will return for follow-up visit so we can further assess her needs.  I discussed that if I am to manage her chronic pain we will need her narcotic contract.  As well I would like her to decrease her Klonopin to 1 pill nightly.  She says that she has reached out to the [facility] to help get her Lyrica we will readdress this at her next visit to see if I can help get her  this medicine as it has been helpful in the past.  Recheck TSH for her hypothyroidism.  She is taking her medication daily  Follow up: 4 weeks for pain management or as needed for any worsening symptoms or changes in condition.   Commons side effects, risks, benefits, and alternatives for medications and treatment plan prescribed today were discussed, and the patient expressed understanding of the given instructions. Patient is instructed to call or message via MyChart if he/she has any questions or concerns regarding our treatment plan. No barriers to understanding were identified. We discussed Red Flag symptoms and signs in detail. Patient expressed understanding regarding what to do in case of urgent or emergency type symptoms.   Medication list was reconciled, printed and provided to the patient in AVS. Patient instructions and summary information was reviewed with the patient as documented in the AVS. This note was prepared with assistance of Dragon voice recognition software. Occasional wrong-word or sound-a-like substitutions may have occurred due to the inherent limitations of voice recognition software  Orders Placed This Encounter  Procedures  . CBC with Differential/Platelet  . Comprehensive metabolic panel  . Lipid panel  . TSH  .  Ambulatory referral to Pain Clinic   Meds ordered this encounter  Medications  . clonazePAM (KLONOPIN) 1 MG tablet    Sig: TAKE 1 TABLET TWICE DAILY AS NEEDED FOR ANXIETY    Dispense:  60 tablet    Refill:  0  . oxyCODONE-acetaminophen (PERCOCET/ROXICET) 5-325 MG tablet    Sig: Take 1 tablet by mouth daily as needed for severe pain.    Dispense:  30 tablet    Refill:  0     Labs reviewed. The 10-year ASCVD risk score Mikey Bussing DC Brooke Bonito., et al., 2013) is: 6%   Values used to calculate the score:     Age: 43 years     Sex: Female     Is Non-Hispanic African American: No     Diabetic: No     Tobacco smoker: No     Systolic Blood Pressure: 550  mmHg     Is BP treated: No     HDL Cholesterol: 61.9 mg/dL     Total Cholesterol: 240 mg/dL

## 2017-04-06 ENCOUNTER — Encounter: Payer: Self-pay | Admitting: Family Medicine

## 2017-04-06 NOTE — Progress Notes (Signed)
Labs reviewed. Stable labs to be discussed at next visit. No med changes.  05/14/2017 ASCVD 6%; will discuss diet and low cholesterol diet and weight loss.

## 2017-05-04 DIAGNOSIS — M23331 Other meniscus derangements, other medial meniscus, right knee: Secondary | ICD-10-CM | POA: Diagnosis not present

## 2017-05-04 DIAGNOSIS — S83241A Other tear of medial meniscus, current injury, right knee, initial encounter: Secondary | ICD-10-CM | POA: Diagnosis not present

## 2017-05-04 DIAGNOSIS — S83281A Other tear of lateral meniscus, current injury, right knee, initial encounter: Secondary | ICD-10-CM | POA: Diagnosis not present

## 2017-05-04 DIAGNOSIS — M11261 Other chondrocalcinosis, right knee: Secondary | ICD-10-CM | POA: Diagnosis not present

## 2017-05-04 DIAGNOSIS — M11861 Other specified crystal arthropathies, right knee: Secondary | ICD-10-CM | POA: Diagnosis not present

## 2017-05-04 DIAGNOSIS — G8918 Other acute postprocedural pain: Secondary | ICD-10-CM | POA: Diagnosis not present

## 2017-05-04 DIAGNOSIS — S83281D Other tear of lateral meniscus, current injury, right knee, subsequent encounter: Secondary | ICD-10-CM | POA: Diagnosis not present

## 2017-05-04 DIAGNOSIS — S83241D Other tear of medial meniscus, current injury, right knee, subsequent encounter: Secondary | ICD-10-CM | POA: Diagnosis not present

## 2017-05-04 DIAGNOSIS — M94261 Chondromalacia, right knee: Secondary | ICD-10-CM | POA: Diagnosis not present

## 2017-05-04 DIAGNOSIS — M23361 Other meniscus derangements, other lateral meniscus, right knee: Secondary | ICD-10-CM | POA: Diagnosis not present

## 2017-05-04 DIAGNOSIS — M23261 Derangement of other lateral meniscus due to old tear or injury, right knee: Secondary | ICD-10-CM | POA: Diagnosis not present

## 2017-05-04 DIAGNOSIS — M23231 Derangement of other medial meniscus due to old tear or injury, right knee: Secondary | ICD-10-CM | POA: Diagnosis not present

## 2017-05-11 DIAGNOSIS — M11861 Other specified crystal arthropathies, right knee: Secondary | ICD-10-CM | POA: Diagnosis not present

## 2017-05-11 DIAGNOSIS — M2241 Chondromalacia patellae, right knee: Secondary | ICD-10-CM | POA: Diagnosis not present

## 2017-05-11 DIAGNOSIS — R2689 Other abnormalities of gait and mobility: Secondary | ICD-10-CM | POA: Diagnosis not present

## 2017-05-11 DIAGNOSIS — M25561 Pain in right knee: Secondary | ICD-10-CM | POA: Diagnosis not present

## 2017-05-11 DIAGNOSIS — M25661 Stiffness of right knee, not elsewhere classified: Secondary | ICD-10-CM | POA: Diagnosis not present

## 2017-05-14 ENCOUNTER — Encounter: Payer: Self-pay | Admitting: *Deleted

## 2017-05-14 ENCOUNTER — Ambulatory Visit: Payer: Medicare HMO | Admitting: Family Medicine

## 2017-05-14 ENCOUNTER — Ambulatory Visit (INDEPENDENT_AMBULATORY_CARE_PROVIDER_SITE_OTHER): Payer: Medicare HMO | Admitting: Family Medicine

## 2017-05-14 ENCOUNTER — Encounter: Payer: Self-pay | Admitting: Family Medicine

## 2017-05-14 VITALS — BP 130/80 | HR 69 | Temp 98.2°F | Ht 62.0 in | Wt 156.0 lb

## 2017-05-14 DIAGNOSIS — R69 Illness, unspecified: Secondary | ICD-10-CM | POA: Diagnosis not present

## 2017-05-14 DIAGNOSIS — F1190 Opioid use, unspecified, uncomplicated: Secondary | ICD-10-CM | POA: Diagnosis not present

## 2017-05-14 DIAGNOSIS — G894 Chronic pain syndrome: Secondary | ICD-10-CM | POA: Diagnosis not present

## 2017-05-14 DIAGNOSIS — F339 Major depressive disorder, recurrent, unspecified: Secondary | ICD-10-CM

## 2017-05-14 DIAGNOSIS — M5136 Other intervertebral disc degeneration, lumbar region: Secondary | ICD-10-CM | POA: Diagnosis not present

## 2017-05-14 DIAGNOSIS — M797 Fibromyalgia: Secondary | ICD-10-CM | POA: Diagnosis not present

## 2017-05-14 DIAGNOSIS — G4701 Insomnia due to medical condition: Secondary | ICD-10-CM | POA: Insufficient documentation

## 2017-05-14 DIAGNOSIS — Z9889 Other specified postprocedural states: Secondary | ICD-10-CM

## 2017-05-14 MED ORDER — CLONAZEPAM 1 MG PO TABS: ORAL_TABLET | ORAL | 0 refills | Status: DC

## 2017-05-14 MED ORDER — DOXEPIN HCL 10 MG PO CAPS: 10.0000 mg | ORAL_CAPSULE | Freq: Every day | ORAL | 1 refills | Status: DC

## 2017-05-14 NOTE — Progress Notes (Signed)
Subjective  CC:  Chief Complaint  Patient presents with  . Right knee pain    Sx 05/04/2017  . pain management    HPI: Allison Grant is a 72 y.o. female who presents to the office today to address the problems listed above in the chief complaint.  72 year old female with fibromyalgia, chronic pain syndrome on chronic narcotics presents to follow-up to discuss pain management.  Today's interview and exam was assisted by the use of an interpreter.  Patient recently had meniscal repair surgery in her right knee.  Orthopedics is treating her pain with hydrocodone and she is taking this about 3 times a day which has been helpful.  She is not using Percocet during this time.  We reviewed together her long history of chronic pain, her multiple referrals to pain management centers, orthopedics, and recently no one [facility].  She was recently treated with epidural steroid injections which she feels did not help her.  She never went back for follow-up.  I reviewed her records from care everywhere.  Chronic depression not taking medications nor seeing psychiatry at this time.  However this is improved to to caring for her grandson at home at this time.  Indication for chronic opioid: DDD, multiple sites; chronic pain syndrome Medication and dose: see med list # pills per month: 30 day supply, 3 refillsto Last UDS date: today Pain contract signed (Y/N): y Date narcotic database last reviewed (include red flags): today I reviewed the patients updated PMH, FH, and SocHx.    Patient Active Problem List   Diagnosis Date Noted  . Chronic pain syndrome 05/14/2017    Priority: High  . Chronic narcotic use 05/14/2017    Priority: High  . Colon adenoma 08/02/2015    Priority: High  . Fibromyalgia 12/02/2014    Priority: High  . Chronic recurrent major depressive disorder ([place]) 12/02/2014    Priority: High  . Hypothyroidism 06/24/2013    Priority: High  . DDD (degenerative disc  disease), lumbar 11/06/2012    Priority: High  . Sciatica 08/08/2012    Priority: High  . Personal history of calcium pyrophosphate deposition disease (CPPD) 11/12/2015    Priority: Medium  . Osteopenia 11/21/2013    Priority: Medium  . OA (osteoarthritis) 10/23/2012    Priority: Medium  . Celiac sprue 08/08/2012    Priority: Medium  . GERD (gastroesophageal reflux disease) 08/08/2012    Priority: Medium  . Moderate persistent asthma without complication 63/89/3734    Priority: Medium  . Diverticulosis 08/08/2012    Priority: Low  . Insomnia due to medical condition 05/14/2017   Current Meds  Medication Sig  . clonazePAM (KLONOPIN) 1 MG tablet TAKE 1 TABLET TWICE DAILY AS NEEDED FOR ANXIETY  . dicyclomine (BENTYL) 10 MG capsule Take 10 mg by mouth 2 (two) times daily as needed.   . doxepin (SINEQUAN) 10 MG capsule Take 1 capsule (10 mg total) by mouth at bedtime.  Marland Kitchen HYDROcodone-acetaminophen (NORCO/VICODIN) 5-325 MG tablet   . lansoprazole (PREVACID) 30 MG capsule Take 30 mg by mouth 2 (two) times daily before a meal.   . levothyroxine (SYNTHROID, LEVOTHROID) 25 MCG tablet Take 25 mcg by mouth daily before breakfast.   . Polyethylene Glycol 3350 GRAN Take 1 each by mouth daily as needed.   . [DISCONTINUED] clonazePAM (KLONOPIN) 1 MG tablet TAKE 1 TABLET TWICE DAILY AS NEEDED FOR ANXIETY  . [DISCONTINUED] doxepin (SINEQUAN) 10 MG capsule Take one 82m capsule daily  Allergies: Patient is allergic to celebrex [celecoxib]. Family History: Patient family history includes Arthritis in her mother; Asthma in her mother and sister; Cancer in her paternal grandmother; Diabetes in her paternal grandmother; Emphysema in her father; Heart disease in her mother; Hyperlipidemia in her father; Hypertension in her mother and paternal grandmother; Mental illness in her mother; Seizures in her sister; Thyroid disease in her mother. Social History:  Patient  reports that  has never smoked. she  has never used smokeless tobacco. She reports that she does not drink alcohol or use drugs.  Review of Systems: Constitutional: Negative for fever malaise or anorexia Cardiovascular: negative for chest pain Respiratory: negative for SOB or persistent cough Gastrointestinal: negative for abdominal pain  Objective  Vitals: BP 130/80 (BP Location: Left Arm, Patient Position: Sitting, Cuff Size: Normal)   Pulse 69   Temp 98.2 F (36.8 C) (Oral)   Ht 5' 2"  (1.575 m)   Wt 156 lb (70.8 kg)   SpO2 99%   BMI 28.53 kg/m  General: no acute distress , A&Ox3 HEENT: PEERL, conjunctiva normal, Oropharynx moist,neck is supple Cardiovascular:  RRR without murmur or gallop.  Respiratory:  Good breath sounds bilaterally, CTAB with normal respiratory effort Skin:  Warm, no rashes  Assessment  1. Chronic pain syndrome   2. Chronic narcotic use   3. DDD (degenerative disc disease), lumbar   4. Fibromyalgia   5. Chronic recurrent major depressive disorder (HCC)   6. Status post medial meniscus repair   7. Insomnia due to medical condition      Plan   Chronic pain syndrome/management:  Agreed that she will likely need management of chronic pain with chronic opioids; she has had full prior evaluations with neurology and ortho and pain management.  I recommend her following up with [facility] see Dr. Leontine Locket can continue working on pain control management.  She agrees to this care plan.  Once she becomes stable on a chronic opioid regimen, then I can consider taking over her care due to the distance she is traveling to [place].  Today's visit was 30 minutes long. Greater than 50% of this time was devoted to face to face counseling with the patient and coordination of care. We discussed her diagnosis, prognosis, treatment options and chronic pain management goals of care and need for continuing follow with prior MD rather than going from place to place.   Insomnia and anxiety -  doing well on new med: doxepin and bid klonopin. Hasn't been able to wean off klonopin. Refilled meds.  Follow up: Return in about 3 months (around 08/12/2017).    Commons side effects, risks, benefits, and alternatives for medications and treatment plan prescribed today were discussed, and the patient expressed understanding of the given instructions. Patient is instructed to call or message via MyChart if he/she has any questions or concerns regarding our treatment plan. No barriers to understanding were identified. We discussed Red Flag symptoms and signs in detail. Patient expressed understanding regarding what to do in case of urgent or emergency type symptoms.   Medication list was reconciled, printed and provided to the patient in AVS. Patient instructions and summary information was reviewed with the patient as documented in the AVS. This note was prepared with assistance of Dragon voice recognition software. Occasional wrong-word or sound-a-like substitutions may have occurred due to the inherent limitations of voice recognition software  No orders of the defined types were placed in this encounter.  Meds ordered this encounter  Medications  . clonazePAM (KLONOPIN) 1 MG tablet    Sig: TAKE 1 TABLET TWICE DAILY AS NEEDED FOR ANXIETY    Dispense:  60 tablet    Refill:  0  . doxepin (SINEQUAN) 10 MG capsule    Sig: Take 1 capsule (10 mg total) by mouth at bedtime.    Dispense:  90 capsule    Refill:  1

## 2017-05-14 NOTE — Patient Instructions (Addendum)
Please make a follow up appointment with Dr. Sandie Ano [facility] in [facility]. [phone] For chronic pain management. Consider restarting Lyrica as well.   Please return in 3 months for follow up with me to recheck how you are doing.   If you have any questions or concerns, please don't hesitate to send me a message via MyChart or call the office at [phone]. Thank you for visiting with Allison Grant today! It's our pleasure caring for you.

## 2017-05-15 DIAGNOSIS — M25661 Stiffness of right knee, not elsewhere classified: Secondary | ICD-10-CM | POA: Diagnosis not present

## 2017-05-15 DIAGNOSIS — R2689 Other abnormalities of gait and mobility: Secondary | ICD-10-CM | POA: Diagnosis not present

## 2017-05-15 DIAGNOSIS — M2241 Chondromalacia patellae, right knee: Secondary | ICD-10-CM | POA: Diagnosis not present

## 2017-05-15 DIAGNOSIS — M25561 Pain in right knee: Secondary | ICD-10-CM | POA: Diagnosis not present

## 2017-05-17 DIAGNOSIS — R2689 Other abnormalities of gait and mobility: Secondary | ICD-10-CM | POA: Diagnosis not present

## 2017-05-17 DIAGNOSIS — M2241 Chondromalacia patellae, right knee: Secondary | ICD-10-CM | POA: Diagnosis not present

## 2017-05-17 DIAGNOSIS — M25661 Stiffness of right knee, not elsewhere classified: Secondary | ICD-10-CM | POA: Diagnosis not present

## 2017-05-17 DIAGNOSIS — M25561 Pain in right knee: Secondary | ICD-10-CM | POA: Diagnosis not present

## 2017-05-21 DIAGNOSIS — M25561 Pain in right knee: Secondary | ICD-10-CM | POA: Diagnosis not present

## 2017-05-21 DIAGNOSIS — R2689 Other abnormalities of gait and mobility: Secondary | ICD-10-CM | POA: Diagnosis not present

## 2017-05-21 DIAGNOSIS — M25661 Stiffness of right knee, not elsewhere classified: Secondary | ICD-10-CM | POA: Diagnosis not present

## 2017-05-21 DIAGNOSIS — M2241 Chondromalacia patellae, right knee: Secondary | ICD-10-CM | POA: Diagnosis not present

## 2017-05-23 DIAGNOSIS — M25561 Pain in right knee: Secondary | ICD-10-CM | POA: Diagnosis not present

## 2017-05-23 DIAGNOSIS — M25661 Stiffness of right knee, not elsewhere classified: Secondary | ICD-10-CM | POA: Diagnosis not present

## 2017-05-23 DIAGNOSIS — R2689 Other abnormalities of gait and mobility: Secondary | ICD-10-CM | POA: Diagnosis not present

## 2017-05-23 DIAGNOSIS — M2241 Chondromalacia patellae, right knee: Secondary | ICD-10-CM | POA: Diagnosis not present

## 2017-05-29 DIAGNOSIS — R2689 Other abnormalities of gait and mobility: Secondary | ICD-10-CM | POA: Diagnosis not present

## 2017-05-29 DIAGNOSIS — M25661 Stiffness of right knee, not elsewhere classified: Secondary | ICD-10-CM | POA: Diagnosis not present

## 2017-05-29 DIAGNOSIS — M2241 Chondromalacia patellae, right knee: Secondary | ICD-10-CM | POA: Diagnosis not present

## 2017-05-29 DIAGNOSIS — M25561 Pain in right knee: Secondary | ICD-10-CM | POA: Diagnosis not present

## 2017-06-08 DIAGNOSIS — H43811 Vitreous degeneration, right eye: Secondary | ICD-10-CM | POA: Diagnosis not present

## 2017-06-08 DIAGNOSIS — H0100A Unspecified blepharitis right eye, upper and lower eyelids: Secondary | ICD-10-CM | POA: Diagnosis not present

## 2017-06-08 DIAGNOSIS — H52203 Unspecified astigmatism, bilateral: Secondary | ICD-10-CM | POA: Diagnosis not present

## 2017-06-08 DIAGNOSIS — H04123 Dry eye syndrome of bilateral lacrimal glands: Secondary | ICD-10-CM | POA: Diagnosis not present

## 2017-06-19 DIAGNOSIS — M1711 Unilateral primary osteoarthritis, right knee: Secondary | ICD-10-CM | POA: Diagnosis not present

## 2017-06-19 DIAGNOSIS — M1712 Unilateral primary osteoarthritis, left knee: Secondary | ICD-10-CM | POA: Diagnosis not present

## 2017-06-19 DIAGNOSIS — M545 Low back pain: Secondary | ICD-10-CM | POA: Diagnosis not present

## 2017-06-25 ENCOUNTER — Other Ambulatory Visit: Payer: Self-pay | Admitting: Family Medicine

## 2017-06-25 ENCOUNTER — Telehealth: Payer: Self-pay | Admitting: Family Medicine

## 2017-06-25 ENCOUNTER — Other Ambulatory Visit: Payer: Self-pay | Admitting: Emergency Medicine

## 2017-06-25 MED ORDER — CLONAZEPAM 1 MG PO TABS: ORAL_TABLET | ORAL | 0 refills | Status: DC

## 2017-06-25 MED ORDER — LANSOPRAZOLE 30 MG PO CPDR: 30.0000 mg | DELAYED_RELEASE_CAPSULE | Freq: Two times a day (BID) | ORAL | 3 refills | Status: AC

## 2017-06-25 MED ORDER — CLONAZEPAM 1 MG PO TABS: ORAL_TABLET | ORAL | 5 refills | Status: DC

## 2017-06-25 NOTE — Telephone Encounter (Signed)
Refill sent to Pharmacy.    Doloris Hall, LPN

## 2017-06-25 NOTE — Telephone Encounter (Signed)
LOV: 05/14/17 PCP: Dr. Billey Chang Pharmacy: [facility]

## 2017-06-25 NOTE — Telephone Encounter (Signed)
Copied from [place]. Topic: Quick Communication - Rx Refill/Question >> Jun 25, 2017 10:24 AM Oliver Pila B wrote: Medication: clonazePAM (KLONOPIN) 1 MG tablet [[id number]] ,    Has the patient contacted their pharmacy? Yes.     (Agent: If no, request that the patient contact the pharmacy for the refill.)   Preferred Pharmacy (with phone number [facility] street [facility]): [facility]   Agent: Please be advised that RX refills may take up to 3 business days. We ask that you follow-up with your pharmacy.

## 2017-06-25 NOTE — Addendum Note (Signed)
Addended by: Billey Chang on: 06/25/2017 04:57 PM   Modules accepted: Orders

## 2017-06-25 NOTE — Telephone Encounter (Signed)
Copied from [place]. Topic: Quick Communication - Rx Refill/Question >> Jun 25, 2017 10:39 AM Ether Griffins B wrote: Medication: lansopazole   Has the patient contacted their pharmacy? Yes.     (Agent: If no, request that the patient contact the pharmacy for the refill.)   Preferred Pharmacy (with phone number or street name): [facility], [place] - [address]   Agent: Please be advised that RX refills may take up to 3 business days. We ask that you follow-up with your pharmacy.

## 2017-06-25 NOTE — Telephone Encounter (Signed)
Please see RX request.

## 2017-07-10 DIAGNOSIS — R93819 Abnormal radiologic findings on diagnostic imaging of unspecified testicle: Secondary | ICD-10-CM | POA: Diagnosis not present

## 2017-07-10 DIAGNOSIS — R933 Abnormal findings on diagnostic imaging of other parts of digestive tract: Secondary | ICD-10-CM | POA: Diagnosis not present

## 2017-07-10 DIAGNOSIS — R197 Diarrhea, unspecified: Secondary | ICD-10-CM | POA: Diagnosis not present

## 2017-07-12 DIAGNOSIS — K5900 Constipation, unspecified: Secondary | ICD-10-CM | POA: Diagnosis not present

## 2017-07-12 DIAGNOSIS — J309 Allergic rhinitis, unspecified: Secondary | ICD-10-CM | POA: Diagnosis not present

## 2017-07-12 DIAGNOSIS — G8929 Other chronic pain: Secondary | ICD-10-CM | POA: Diagnosis not present

## 2017-07-12 DIAGNOSIS — K219 Gastro-esophageal reflux disease without esophagitis: Secondary | ICD-10-CM | POA: Diagnosis not present

## 2017-07-12 DIAGNOSIS — G4700 Insomnia, unspecified: Secondary | ICD-10-CM | POA: Diagnosis not present

## 2017-07-12 DIAGNOSIS — E039 Hypothyroidism, unspecified: Secondary | ICD-10-CM | POA: Diagnosis not present

## 2017-07-12 DIAGNOSIS — R69 Illness, unspecified: Secondary | ICD-10-CM | POA: Diagnosis not present

## 2017-07-12 DIAGNOSIS — Z8249 Family history of ischemic heart disease and other diseases of the circulatory system: Secondary | ICD-10-CM | POA: Diagnosis not present

## 2017-07-12 DIAGNOSIS — F419 Anxiety disorder, unspecified: Secondary | ICD-10-CM | POA: Diagnosis not present

## 2017-07-12 DIAGNOSIS — Z79891 Long term (current) use of opiate analgesic: Secondary | ICD-10-CM | POA: Diagnosis not present

## 2017-07-17 DIAGNOSIS — M1711 Unilateral primary osteoarthritis, right knee: Secondary | ICD-10-CM | POA: Diagnosis not present

## 2017-07-17 DIAGNOSIS — M545 Low back pain: Secondary | ICD-10-CM | POA: Diagnosis not present

## 2017-07-17 DIAGNOSIS — M1712 Unilateral primary osteoarthritis, left knee: Secondary | ICD-10-CM | POA: Diagnosis not present

## 2017-07-24 ENCOUNTER — Other Ambulatory Visit: Payer: Self-pay | Admitting: Family Medicine

## 2017-07-24 MED ORDER — LEVOTHYROXINE SODIUM 25 MCG PO TABS: 25.0000 ug | ORAL_TABLET | Freq: Every day | ORAL | 3 refills | Status: DC

## 2017-07-24 NOTE — Telephone Encounter (Signed)
Copied from [place] [id number]. Topic: Quick Communication - Rx Refill/Question >> Jul 24, 2017  1:05 PM Carolyn Stare wrote: Medication levothyroxine (SYNTHROID, LEVOTHROID) 25 MCG tablet  Has the patient contacted their pharmacy yes     Preferred Pharmacy [facility]     Agent: Please be advised that RX refills may take up to 3 business days. We ask that you follow-up with your pharmacy.

## 2017-07-24 NOTE — Telephone Encounter (Signed)
Last OV: 04/05/17 PCP: [place]: [facility] [address] [place] - [place] [address] [phone] (Phone) [phone] (Fax)   Last filled by historical provider.

## 2017-08-16 ENCOUNTER — Ambulatory Visit (INDEPENDENT_AMBULATORY_CARE_PROVIDER_SITE_OTHER): Payer: Medicare HMO | Admitting: Family Medicine

## 2017-08-16 ENCOUNTER — Encounter: Payer: Self-pay | Admitting: Family Medicine

## 2017-08-16 ENCOUNTER — Other Ambulatory Visit: Payer: Self-pay

## 2017-08-16 VITALS — BP 132/88 | HR 71 | Temp 97.7°F | Resp 16 | Ht 62.0 in | Wt 160.2 lb

## 2017-08-16 DIAGNOSIS — M797 Fibromyalgia: Secondary | ICD-10-CM

## 2017-08-16 DIAGNOSIS — G894 Chronic pain syndrome: Secondary | ICD-10-CM | POA: Diagnosis not present

## 2017-08-16 DIAGNOSIS — K900 Celiac disease: Secondary | ICD-10-CM

## 2017-08-16 DIAGNOSIS — Z23 Encounter for immunization: Secondary | ICD-10-CM | POA: Diagnosis not present

## 2017-08-16 DIAGNOSIS — F1190 Opioid use, unspecified, uncomplicated: Secondary | ICD-10-CM

## 2017-08-16 DIAGNOSIS — R69 Illness, unspecified: Secondary | ICD-10-CM | POA: Diagnosis not present

## 2017-08-16 DIAGNOSIS — F339 Major depressive disorder, recurrent, unspecified: Secondary | ICD-10-CM

## 2017-08-16 MED ORDER — FLUTICASONE PROPIONATE 50 MCG/ACT NA SUSP: 2.0000 | Freq: Every day | NASAL | 6 refills | Status: AC

## 2017-08-16 MED ORDER — HYDROCODONE-ACETAMINOPHEN 5-325 MG PO TABS: 1.0000 | ORAL_TABLET | Freq: Two times a day (BID) | ORAL | 0 refills | Status: DC | PRN

## 2017-08-16 NOTE — Patient Instructions (Signed)
Please return in December for your annual complete physical; please come fasting. Also, needs AWV in December.   Please see Dr. Leontine Locket to manage your chronic pain medication.   If you have any questions or concerns, please don't hesitate to send me a message via MyChart or call the office at [phone]. Thank you for visiting with [place] today! It's our pleasure caring for you.

## 2017-08-16 NOTE — Progress Notes (Signed)
Subjective  CC:  Chief Complaint  Patient presents with  . Pain    patient stated that she is here for a regular check up?    HPI: Allison Grant is a 72 y.o. female who presents to the office today to address the problems listed above in the chief complaint.  Here for chronic pain f/u. See last note. Was to see Dr. Leontine Locket at [facility] but didn't get an appt until may 2nd. Took last norco yesterday. Typically uses once/twice a day. Helps alleviate pain but never completely. No new sxs.   Diarrhea and abdominal pain: had gi eval again and colonoscopy 06/2017 - I reviewed notes. She has f/u. Says still a problem.   Not on mood meds. Refuses.    HM: due for shingrix and pneumovax I reviewed the patients updated PMH, FH, and SocHx.    Patient Active Problem List   Diagnosis Date Noted  . Chronic pain syndrome 05/14/2017    Priority: High  . Chronic narcotic use 05/14/2017    Priority: High  . Colon adenoma 08/02/2015    Priority: High  . Fibromyalgia 12/02/2014    Priority: High  . Chronic recurrent major depressive disorder ([place]) 12/02/2014    Priority: High  . Hypothyroidism 06/24/2013    Priority: High  . DDD (degenerative disc disease), lumbar 11/06/2012    Priority: High  . Sciatica 08/08/2012    Priority: High  . Personal history of calcium pyrophosphate deposition disease (CPPD) 11/12/2015    Priority: Medium  . Osteopenia 11/21/2013    Priority: Medium  . OA (osteoarthritis) 10/23/2012    Priority: Medium  . Celiac sprue 08/08/2012    Priority: Medium  . GERD (gastroesophageal reflux disease) 08/08/2012    Priority: Medium  . Moderate persistent asthma without complication 69/67/8938    Priority: Medium  . Diverticulosis 08/08/2012    Priority: Low  . Insomnia due to medical condition 05/14/2017   Current Meds  Medication Sig  . clonazePAM (KLONOPIN) 1 MG tablet TAKE 1 TABLET TWICE DAILY AS NEEDED FOR ANXIETY  . dicyclomine (BENTYL) 10 MG capsule  Take 10 mg by mouth 2 (two) times daily as needed.   . doxepin (SINEQUAN) 10 MG capsule Take 1 capsule (10 mg total) by mouth at bedtime.  Marland Kitchen HYDROcodone-acetaminophen (NORCO/VICODIN) 5-325 MG tablet Take 1-2 tablets by mouth 2 (two) times daily as needed for moderate pain.  Marland Kitchen lansoprazole (PREVACID) 30 MG capsule Take 1 capsule (30 mg total) by mouth 2 (two) times daily before a meal.  . levothyroxine (SYNTHROID, LEVOTHROID) 25 MCG tablet Take 1 tablet (25 mcg total) by mouth daily before breakfast.  . Polyethylene Glycol 3350 GRAN Take 1 each by mouth daily as needed.   . [DISCONTINUED] HYDROcodone-acetaminophen (NORCO/VICODIN) 5-325 MG tablet     Allergies: Patient is allergic to celecoxib. Family History: Patient family history includes Arthritis in her mother; Asthma in her mother and sister; Cancer in her paternal grandmother; Diabetes in her paternal grandmother; Emphysema in her father; Heart disease in her mother; Hyperlipidemia in her father; Hypertension in her mother and paternal grandmother; Mental illness in her mother; Seizures in her sister; Thyroid disease in her mother. Social History:  Patient  reports that she has never smoked. She has never used smokeless tobacco. She reports that she does not drink alcohol or use drugs.  Review of Systems: Constitutional: Negative for fever malaise or anorexia Cardiovascular: negative for chest pain Respiratory: negative for SOB or persistent cough Gastrointestinal:  negative for abdominal pain  Objective  Vitals: BP 132/88   Pulse 71   Temp 97.7 F (36.5 C) (Oral)   Resp 16   Ht 5' 2"  (1.575 m)   Wt 160 lb 3.2 oz (72.7 kg)   SpO2 96%   BMI 29.30 kg/m  General: no acute distress , A&Ox3, appears comfortable and pleasant Psych : nl speech, well groomed. Not sedated Assessment  1. Chronic pain syndrome   2. Chronic narcotic use   3. Chronic recurrent major depressive disorder ([place])   4. Fibromyalgia   5. Celiac sprue   6.  Pneumococcal vaccination administered at current visit      Plan   pain:  To f/u with spine specialist for chronic pain mgt. Refilled norco x 1 month. Further pain meds from chronic pain. No new sxs or red flags.   Mood - stable and chronic depression refractive to meds. Not interested in other therapies.   Abdominal pain - per gi.  Updated pneumovax today. Pt declines shingrix due to cost.   Follow up: Return in about 8 months (around 04/17/2018) for complete physical, AWV.    Commons side effects, risks, benefits, and alternatives for medications and treatment plan prescribed today were discussed, and the patient expressed understanding of the given instructions. Patient is instructed to call or message via MyChart if he/she has any questions or concerns regarding our treatment plan. No barriers to understanding were identified. We discussed Red Flag symptoms and signs in detail. Patient expressed understanding regarding what to do in case of urgent or emergency type symptoms.   Medication list was reconciled, printed and provided to the patient in AVS. Patient instructions and summary information was reviewed with the patient as documented in the AVS. This note was prepared with assistance of Dragon voice recognition software. Occasional wrong-word or sound-a-like substitutions may have occurred due to the inherent limitations of voice recognition software  Orders Placed This Encounter  Procedures  . Pneumococcal polysaccharide vaccine 23-valent greater than or equal to 2yo subcutaneous/IM   Meds ordered this encounter  Medications  . HYDROcodone-acetaminophen (NORCO/VICODIN) 5-325 MG tablet    Sig: Take 1-2 tablets by mouth 2 (two) times daily as needed for moderate pain.    Dispense:  60 tablet    Refill:  0  . fluticasone (FLONASE) 50 MCG/ACT nasal spray    Sig: Place 2 sprays into both nostrils daily.    Dispense:  16 g    Refill:  6

## 2017-08-30 DIAGNOSIS — K210 Gastro-esophageal reflux disease with esophagitis: Secondary | ICD-10-CM | POA: Diagnosis not present

## 2017-08-30 DIAGNOSIS — K588 Other irritable bowel syndrome: Secondary | ICD-10-CM | POA: Diagnosis not present

## 2017-09-27 DIAGNOSIS — M5136 Other intervertebral disc degeneration, lumbar region: Secondary | ICD-10-CM | POA: Diagnosis not present

## 2017-09-27 DIAGNOSIS — M797 Fibromyalgia: Secondary | ICD-10-CM | POA: Diagnosis not present

## 2017-09-27 DIAGNOSIS — M545 Low back pain: Secondary | ICD-10-CM | POA: Diagnosis not present

## 2017-09-27 DIAGNOSIS — M533 Sacrococcygeal disorders, not elsewhere classified: Secondary | ICD-10-CM | POA: Diagnosis not present

## 2017-09-27 DIAGNOSIS — G8929 Other chronic pain: Secondary | ICD-10-CM | POA: Diagnosis not present

## 2017-09-27 DIAGNOSIS — M47816 Spondylosis without myelopathy or radiculopathy, lumbar region: Secondary | ICD-10-CM | POA: Diagnosis not present

## 2017-10-11 ENCOUNTER — Ambulatory Visit: Payer: Medicare HMO | Admitting: Family Medicine

## 2017-10-15 ENCOUNTER — Encounter: Payer: Self-pay | Admitting: Family Medicine

## 2017-10-15 ENCOUNTER — Ambulatory Visit (INDEPENDENT_AMBULATORY_CARE_PROVIDER_SITE_OTHER): Payer: Medicare HMO | Admitting: Family Medicine

## 2017-10-15 ENCOUNTER — Other Ambulatory Visit: Payer: Self-pay

## 2017-10-15 VITALS — BP 122/80 | HR 71 | Temp 97.9°F | Resp 16 | Ht 62.0 in | Wt 166.2 lb

## 2017-10-15 DIAGNOSIS — G894 Chronic pain syndrome: Secondary | ICD-10-CM | POA: Diagnosis not present

## 2017-10-15 DIAGNOSIS — M797 Fibromyalgia: Secondary | ICD-10-CM

## 2017-10-15 DIAGNOSIS — M5136 Other intervertebral disc degeneration, lumbar region: Secondary | ICD-10-CM | POA: Diagnosis not present

## 2017-10-15 MED ORDER — HYDROCODONE-ACETAMINOPHEN 5-325 MG PO TABS: 1.0000 | ORAL_TABLET | Freq: Two times a day (BID) | ORAL | 0 refills | Status: DC | PRN

## 2017-10-15 NOTE — Progress Notes (Signed)
Subjective  CC:  Chief Complaint  Patient presents with  . Back Pain    Lower back pain, more in right side, radiates down both legs, getting worse for about a month    HPI: Allison Grant is a 72 y.o. female who presents to the office today to address the problems listed above in the chief complaint.  72 year old female with fibromyalgia, arthritis, chronic pain syndrome presents after seeing [place] spine specialist.  I reviewed that note in detail.  Neurosurgeon does not feel that she is a surgical candidate and feels that injection therapy could be helpful but patient would not like to have any more of that.  He does not feel she is a narcotic candidate due to her chronic fibromyalgia pain.  She is here due to persistent pain.  She has had a long history of pain and a significant work-up documented notes prior.  She is failed pain clinics, orthopedic and neuro surgical specialist.  She requests Percocet to treat her pain.  She denies symptoms of depression at this time but admits to anxiety.  She is on chronic Klonopin.  She does not feel that she needs psychiatrist at this time.  She has been to psychiatrist in the past for depression.  Lyrica does help her but other typical treatments for fibromyalgia have not. Assessment  1. Chronic pain syndrome   2. Fibromyalgia   3. DDD (degenerative disc disease), lumbar      Plan   Chronic pain, fibromyalgia: Counseling done.  Will refer her again to chronic pain management.  I defer to them whether she should be treated with chronic narcotics or not.  I explained to her that I will defer to them and will no longer treat with narcotics if they feel that she is not a proper candidate.  I explained and counseled regarding chronic pain, anxiety, depression and poor sleep.  Referral placed.  Norco refilled for 1 month to bridge to chronic pain center.  No further narcotics at this time.  Follow up: December for complete physical annual  wellness visit.  Orders Placed This Encounter  Procedures  . Ambulatory referral to Pain Clinic   Meds ordered this encounter  Medications  . HYDROcodone-acetaminophen (NORCO/VICODIN) 5-325 MG tablet    Sig: Take 1-2 tablets by mouth 2 (two) times daily as needed for moderate pain.    Dispense:  60 tablet    Refill:  0      I reviewed the patients updated PMH, FH, and SocHx.    Patient Active Problem List   Diagnosis Date Noted  . Chronic pain syndrome 05/14/2017    Priority: High  . Chronic narcotic use 05/14/2017    Priority: High  . Colon adenoma 08/02/2015    Priority: High  . Fibromyalgia 12/02/2014    Priority: High  . Chronic recurrent major depressive disorder ([place]) 12/02/2014    Priority: High  . Hypothyroidism 06/24/2013    Priority: High  . DDD (degenerative disc disease), lumbar 11/06/2012    Priority: High  . Sciatica 08/08/2012    Priority: High  . Personal history of calcium pyrophosphate deposition disease (CPPD) 11/12/2015    Priority: Medium  . Osteopenia 11/21/2013    Priority: Medium  . OA (osteoarthritis) 10/23/2012    Priority: Medium  . Celiac sprue 08/08/2012    Priority: Medium  . GERD (gastroesophageal reflux disease) 08/08/2012    Priority: Medium  . Moderate persistent asthma without complication 83/25/4982    Priority:  Medium  . Diverticulosis 08/08/2012    Priority: Low  . Insomnia due to medical condition 05/14/2017   Current Meds  Medication Sig  . clonazePAM (KLONOPIN) 1 MG tablet TAKE 1 TABLET TWICE DAILY AS NEEDED FOR ANXIETY  . dicyclomine (BENTYL) 10 MG capsule Take 10 mg by mouth 2 (two) times daily as needed.   . doxepin (SINEQUAN) 10 MG capsule Take 1 capsule (10 mg total) by mouth at bedtime.  . fluticasone (FLONASE) 50 MCG/ACT nasal spray Place 2 sprays into both nostrils daily.  Marland Kitchen HYDROcodone-acetaminophen (NORCO/VICODIN) 5-325 MG tablet Take 1-2 tablets by mouth 2 (two) times daily as needed for moderate pain.  Marland Kitchen  levothyroxine (SYNTHROID, LEVOTHROID) 25 MCG tablet Take 1 tablet (25 mcg total) by mouth daily before breakfast.  . pantoprazole (PROTONIX) 40 MG tablet Take by mouth.  . Polyethylene Glycol 3350 GRAN Take 1 each by mouth daily as needed.   . [DISCONTINUED] HYDROcodone-acetaminophen (NORCO/VICODIN) 5-325 MG tablet Take 1-2 tablets by mouth 2 (two) times daily as needed for moderate pain.    Allergies: Patient is allergic to celecoxib. Family History: Patient family history includes Arthritis in her mother; Asthma in her mother and sister; Cancer in her paternal grandmother; Diabetes in her paternal grandmother; Emphysema in her father; Heart disease in her mother; Hyperlipidemia in her father; Hypertension in her mother and paternal grandmother; Mental illness in her mother; Seizures in her sister; Thyroid disease in her mother. Social History:  Patient  reports that she has never smoked. She has never used smokeless tobacco. She reports that she does not drink alcohol or use drugs.  Review of Systems: Constitutional: Negative for fever malaise or anorexia Cardiovascular: negative for chest pain Respiratory: negative for SOB or persistent cough Gastrointestinal: negative for abdominal pain  Objective  Vitals: BP 122/80   Pulse 71   Temp 97.9 F (36.6 C) (Oral)   Resp 16   Ht 5' 2"  (1.575 m)   Wt 166 lb 3.2 oz (75.4 kg)   SpO2 98%   BMI 30.40 kg/m  General: no acute distress , A&Ox3    Commons side effects, risks, benefits, and alternatives for medications and treatment plan prescribed today were discussed, and the patient expressed understanding of the given instructions. Patient is instructed to call or message via MyChart if he/she has any questions or concerns regarding our treatment plan. No barriers to understanding were identified. We discussed Red Flag symptoms and signs in detail. Patient expressed understanding regarding what to do in case of urgent or emergency type  symptoms.   Medication list was reconciled, printed and provided to the patient in AVS. Patient instructions and summary information was reviewed with the patient as documented in the AVS. This note was prepared with assistance of Dragon voice recognition software. Occasional wrong-word or sound-a-like substitutions may have occurred due to the inherent limitations of voice recognition software

## 2017-10-15 NOTE — Patient Instructions (Signed)
Please return in December for your annual complete physical; please come fasting. Annual wellness is due at that time as well.   We will call you with information regarding your referral appointment. Pain medicine specialist. If you do not hear from [place] within the next 2 weeks, please let me know. It can take 1-2 weeks to get appointments set up with the specialists.    If you have any questions or concerns, please don't hesitate to send me a message via MyChart or call the office at [phone]. Thank you for visiting with [place] today! It's our pleasure caring for you.

## 2017-11-04 ENCOUNTER — Other Ambulatory Visit: Payer: Self-pay | Admitting: Family Medicine

## 2017-11-06 ENCOUNTER — Telehealth: Payer: Self-pay | Admitting: Family Medicine

## 2017-11-06 NOTE — Telephone Encounter (Signed)
Copied from [place] [id number]. Topic: Quick Communication - Rx Refill/Question >> Nov 06, 2017  1:19 PM Neva Seat wrote: doxepin (SINEQUAN) 10 MG capsule  Needing refills  [facility] [address] [place] [address] [place] [place] [address] Phone: [phone] Fax: [phone]

## 2017-11-06 NOTE — Telephone Encounter (Signed)
Medication filled on 11/05/17

## 2017-11-26 DIAGNOSIS — H04123 Dry eye syndrome of bilateral lacrimal glands: Secondary | ICD-10-CM | POA: Diagnosis not present

## 2017-11-26 DIAGNOSIS — H43811 Vitreous degeneration, right eye: Secondary | ICD-10-CM | POA: Diagnosis not present

## 2017-12-11 ENCOUNTER — Other Ambulatory Visit: Payer: Self-pay | Admitting: Family Medicine

## 2017-12-11 NOTE — Telephone Encounter (Signed)
Medication already sent to the pharmacy

## 2017-12-11 NOTE — Telephone Encounter (Signed)
Last OV: 10/15/2017 Last Fill: 06/25/2017

## 2017-12-11 NOTE — Telephone Encounter (Signed)
Copied from [place] [id number]. Topic: Quick Communication - Rx Refill/Question >> Dec 11, 2017  9:22 AM Judyann Munson wrote: Medication: clonazePAM (KLONOPIN) 1 MG tablet Has the patient contacted their pharmacy? No   Preferred Pharmacy (with phone number or street name):[facility], [place] - [address] [phone] (Phone) [phone] (Fax)    Agent: Please be advised that RX refills may take up to 3 business days. We ask that you follow-up with your pharmacy.

## 2017-12-14 ENCOUNTER — Telehealth: Payer: Self-pay | Admitting: Emergency Medicine

## 2017-12-14 NOTE — Telephone Encounter (Signed)
Copied from [place] [id number]. Topic: Referral - Medical Records >> Dec 14, 2017  9:04 AM Scherrie Gerlach wrote: Reason for CRM: pt filled out MR form for pain management. They need her records for the referral from Dr Jonni Sanger.  But pain management has not gotten these records.  Records need to come from Dr [employer] office. But release states to send to the [place] office. Pt needs to get Dr Rennis Harding record to the pain management for referral asap. As the MR was filled out 10/22/17. Thanks.

## 2017-12-14 NOTE — Telephone Encounter (Signed)
Called patient and spoke to her husband Jacqulyn Bath since he spoke english and he stated that he will go to Dr. Newton Pigg office and get her records and send to the [facility] office since they requested these to be sent from there in June 2019 and her records have not been sent yet.

## 2018-01-14 DIAGNOSIS — M797 Fibromyalgia: Secondary | ICD-10-CM | POA: Diagnosis not present

## 2018-01-14 DIAGNOSIS — M545 Low back pain: Secondary | ICD-10-CM | POA: Diagnosis not present

## 2018-01-14 DIAGNOSIS — M5416 Radiculopathy, lumbar region: Secondary | ICD-10-CM | POA: Diagnosis not present

## 2018-02-04 DIAGNOSIS — M62838 Other muscle spasm: Secondary | ICD-10-CM | POA: Diagnosis not present

## 2018-02-04 DIAGNOSIS — M792 Neuralgia and neuritis, unspecified: Secondary | ICD-10-CM | POA: Diagnosis not present

## 2018-02-04 DIAGNOSIS — M797 Fibromyalgia: Secondary | ICD-10-CM | POA: Diagnosis not present

## 2018-02-04 DIAGNOSIS — M545 Low back pain: Secondary | ICD-10-CM | POA: Diagnosis not present

## 2018-02-04 DIAGNOSIS — M20031 Swan-neck deformity of right finger(s): Secondary | ICD-10-CM | POA: Diagnosis not present

## 2018-02-04 DIAGNOSIS — M5416 Radiculopathy, lumbar region: Secondary | ICD-10-CM | POA: Diagnosis not present

## 2018-02-04 DIAGNOSIS — G894 Chronic pain syndrome: Secondary | ICD-10-CM | POA: Diagnosis not present

## 2018-02-04 DIAGNOSIS — M20032 Swan-neck deformity of left finger(s): Secondary | ICD-10-CM | POA: Diagnosis not present

## 2018-02-14 DIAGNOSIS — R69 Illness, unspecified: Secondary | ICD-10-CM | POA: Diagnosis not present

## 2018-02-26 DIAGNOSIS — M5416 Radiculopathy, lumbar region: Secondary | ICD-10-CM | POA: Diagnosis not present

## 2018-02-26 DIAGNOSIS — M797 Fibromyalgia: Secondary | ICD-10-CM | POA: Diagnosis not present

## 2018-02-26 DIAGNOSIS — M545 Low back pain: Secondary | ICD-10-CM | POA: Diagnosis not present

## 2018-03-21 ENCOUNTER — Ambulatory Visit: Payer: Medicare HMO

## 2018-04-01 DIAGNOSIS — M25541 Pain in joints of right hand: Secondary | ICD-10-CM | POA: Diagnosis not present

## 2018-04-01 DIAGNOSIS — M160 Bilateral primary osteoarthritis of hip: Secondary | ICD-10-CM | POA: Diagnosis not present

## 2018-04-01 DIAGNOSIS — Z8739 Personal history of other diseases of the musculoskeletal system and connective tissue: Secondary | ICD-10-CM | POA: Diagnosis not present

## 2018-04-01 DIAGNOSIS — M79641 Pain in right hand: Secondary | ICD-10-CM | POA: Diagnosis not present

## 2018-04-01 DIAGNOSIS — M25542 Pain in joints of left hand: Secondary | ICD-10-CM | POA: Diagnosis not present

## 2018-04-01 DIAGNOSIS — M19041 Primary osteoarthritis, right hand: Secondary | ICD-10-CM | POA: Diagnosis not present

## 2018-04-01 DIAGNOSIS — M47898 Other spondylosis, sacral and sacrococcygeal region: Secondary | ICD-10-CM | POA: Diagnosis not present

## 2018-04-01 DIAGNOSIS — M19031 Primary osteoarthritis, right wrist: Secondary | ICD-10-CM | POA: Diagnosis not present

## 2018-04-01 DIAGNOSIS — M25571 Pain in right ankle and joints of right foot: Secondary | ICD-10-CM | POA: Diagnosis not present

## 2018-04-01 DIAGNOSIS — M19032 Primary osteoarthritis, left wrist: Secondary | ICD-10-CM | POA: Diagnosis not present

## 2018-04-01 DIAGNOSIS — M1990 Unspecified osteoarthritis, unspecified site: Secondary | ICD-10-CM | POA: Diagnosis not present

## 2018-04-01 DIAGNOSIS — M25572 Pain in left ankle and joints of left foot: Secondary | ICD-10-CM | POA: Diagnosis not present

## 2018-04-01 DIAGNOSIS — M19072 Primary osteoarthritis, left ankle and foot: Secondary | ICD-10-CM | POA: Diagnosis not present

## 2018-04-01 DIAGNOSIS — M797 Fibromyalgia: Secondary | ICD-10-CM | POA: Diagnosis not present

## 2018-04-01 DIAGNOSIS — M180 Bilateral primary osteoarthritis of first carpometacarpal joints: Secondary | ICD-10-CM | POA: Diagnosis not present

## 2018-04-01 DIAGNOSIS — M79642 Pain in left hand: Secondary | ICD-10-CM | POA: Diagnosis not present

## 2018-04-01 DIAGNOSIS — M19042 Primary osteoarthritis, left hand: Secondary | ICD-10-CM | POA: Diagnosis not present

## 2018-04-01 DIAGNOSIS — M549 Dorsalgia, unspecified: Secondary | ICD-10-CM | POA: Diagnosis not present

## 2018-04-01 DIAGNOSIS — M533 Sacrococcygeal disorders, not elsewhere classified: Secondary | ICD-10-CM | POA: Diagnosis not present

## 2018-04-01 DIAGNOSIS — M19071 Primary osteoarthritis, right ankle and foot: Secondary | ICD-10-CM | POA: Diagnosis not present

## 2018-04-04 ENCOUNTER — Encounter: Payer: Medicare HMO | Admitting: Family Medicine

## 2018-04-04 DIAGNOSIS — M62838 Other muscle spasm: Secondary | ICD-10-CM | POA: Diagnosis not present

## 2018-04-04 DIAGNOSIS — G894 Chronic pain syndrome: Secondary | ICD-10-CM | POA: Diagnosis not present

## 2018-04-04 DIAGNOSIS — M797 Fibromyalgia: Secondary | ICD-10-CM | POA: Diagnosis not present

## 2018-04-04 DIAGNOSIS — M792 Neuralgia and neuritis, unspecified: Secondary | ICD-10-CM | POA: Diagnosis not present

## 2018-04-09 DIAGNOSIS — R69 Illness, unspecified: Secondary | ICD-10-CM | POA: Diagnosis not present

## 2018-04-09 NOTE — Progress Notes (Signed)
Subjective:   Allison Grant is a 72 y.o. female who presents for Medicare Annual (Subsequent) preventive examination. [facility] [employer], Lockie Mola, present for visit.   Review of Systems:  No ROS.  Medicare Wellness Visit. Additional risk factors are reflected in the social history.  Cardiac Risk Factors include: advanced age (>13mn, >>62women);dyslipidemia;sedentary lifestyle;obesity (BMI >30kg/m2);family history of premature cardiovascular disease   Sleep patterns: Awakens every 2 hours d/t hip/back/knee pain.  Home Safety/Smoke Alarms: Feels safe in home. Smoke alarms in place.  Living environment; residence and Firearm Safety: Lives with husband and grandson in first floor apt.  Seat Belt Safety/Bike Helmet: Wears seat belt.   Female:   Pap-N/A       Mammo-01/29/2017, negative. Ordered today.       Dexa scan-12/13/2015, Osteopenia. Ordered today.       CCS-Colonoscopy 06/2015, recall 10 years.      Objective:     Vitals: BP 120/60 (BP Location: Right Arm, Patient Position: Sitting, Cuff Size: Normal)   Pulse 75   Ht 5' 2"  (1.575 m)   Wt 168 lb 8 oz (76.4 kg)   SpO2 98%   BMI 30.82 kg/m   Body mass index is 30.82 kg/m.  Advanced Directives 04/10/2018 04/05/2017  Does Patient Have a Medical Advance Directive? No No  Would patient like information on creating a medical advance directive? Yes (MAU/Ambulatory/Procedural Areas - Information given) Yes (MAU/Ambulatory/Procedural Areas - Information given)    Tobacco Social History   Tobacco Use  Smoking Status Never Smoker  Smokeless Tobacco Never Used     Counseling given: Not Answered    Past Medical History:  Diagnosis Date  . Arthritis   . Asthma   . Cataracts, bilateral   . 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 Depression   . Disease of thyroid gland   . Diverticulosis   . Fibromyalgia   . Fracture of left ankle  05/2012  . GERD (gastroesophageal reflux disease)   . Migraine   . Osteopenia   . Personal history of calcium pyrophosphate deposition disease (CPPD)   . Sciatica    Past Surgical History:  Procedure Laterality Date  . ABDOMINAL HYSTERECTOMY    . BREAST EXCISIONAL BIOPSY Left 1989   benign  . CATARACT EXTRACTION W/ INTRAOCULAR LENS IMPLANT Left   . COLON SURGERY     March 2019  . FOOT SURGERY    . KNEE SURGERY     Right knee 05/04/2017  . TUBAL LIGATION     Family History  Problem Relation Age of Onset  . Arthritis Mother   . Asthma Mother   . Heart disease Mother   . Hypertension Mother   . Mental illness Mother   . Thyroid disease Mother   . Emphysema Father   . Hyperlipidemia Father   . Asthma Sister   . Seizures Sister   . Cancer Paternal Grandmother   . Diabetes Paternal Grandmother   . Hypertension Paternal Grandmother   . Diabetes Son   . Diabetes Son   . Asthma Son    Social History   Socioeconomic History  . Marital status: Married    Spouse name: Not on file  . Number of children: Not on file  . Years of education: Not on file  . Highest education level: Not on file  Occupational History  . Not on file  Social Needs  .  Financial resource strain: Not on file  . Food insecurity:    Worry: Not on file    Inability: Not on file  . Transportation needs:    Medical: Not on file    Non-medical: Not on file  Tobacco Use  . Smoking status: Never Smoker  . Smokeless tobacco: Never Used  Substance and Sexual Activity  . Alcohol use: No    Frequency: Never  . Drug use: No  . Sexual activity: Never  Lifestyle  . Physical activity:    Days per week: Not on file    Minutes per session: Not on file  . Stress: Not on file  Relationships  . Social connections:    Talks on phone: Not on file    Gets together: Not on file    Attends religious service: Not on file    Active member of club or organization: Not on file    Attends meetings of clubs or  organizations: Not on file    Relationship status: Not on file  Other Topics Concern  . Not on file  Social History Narrative  . Not on file    Outpatient Encounter Medications as of 04/10/2018  Medication Sig  . clonazePAM (KLONOPIN) 1 MG tablet TAKE 1 TABLET BY MOUTH TWICE DAILY AS NEEDED FOR ANXIETY  . dicyclomine (BENTYL) 10 MG capsule Take 10 mg by mouth 2 (two) times daily as needed.   . doxepin (SINEQUAN) 10 MG capsule TAKE 1 CAPSULE BY MOUTH AT BEDTIME  . fluticasone (FLONASE) 50 MCG/ACT nasal spray Place 2 sprays into both nostrils daily.  . hydroxychloroquine (PLAQUENIL) 200 MG tablet Take 200 mg by mouth daily.  Marland Kitchen levothyroxine (SYNTHROID, LEVOTHROID) 25 MCG tablet Take 1 tablet (25 mcg total) by mouth daily before breakfast.  . pantoprazole (PROTONIX) 40 MG tablet Take by mouth.  . Polyethylene Glycol 3350 GRAN Take 1 each by mouth daily as needed.   . pregabalin (LYRICA) 200 MG capsule Take 200 mg by mouth 3 (three) times daily.  Marland Kitchen HYDROcodone-acetaminophen (NORCO/VICODIN) 5-325 MG tablet Take 1-2 tablets by mouth 2 (two) times daily as needed for moderate pain. (Patient not taking: Reported on 04/10/2018)  . lansoprazole (PREVACID) 30 MG capsule Take 1 capsule (30 mg total) by mouth 2 (two) times daily before a meal. (Patient not taking: Reported on 10/15/2017)  . Zoster Vaccine Adjuvanted [facility]) injection Inject 0.5 mLs into the muscle once for 1 dose.   No facility-administered encounter medications on file as of 04/10/2018.     Activities of Daily Living In your present state of health, do you have any difficulty performing the following activities: 04/10/2018  Hearing? N  Vision? N  Difficulty concentrating or making decisions? N  Walking or climbing stairs? Y  Comment Difficulty d/t joint pain  Dressing or bathing? N  Doing errands, shopping? N  Preparing Food and eating ? N  Using the Toilet? N  In the past six months, have you accidently leaked urine? N    Do you have problems with loss of bowel control? N  Managing your Medications? N  Managing your Finances? N  Housekeeping or managing your Housekeeping? N  Some recent data might be hidden    Patient Care Team: Leamon Arnt, MD as PCP - General (Family Medicine) Pa, Collins (Specialist) Manuella Ghazi (Rheumatology)    Assessment:   This is a routine wellness examination for Charlee.  Exercise Activities and Dietary recommendations Current Exercise Habits: The patient does not participate in  regular exercise at present, Exercise limited by: orthopedic condition(s)   Diet (meal preparation, eat out, water intake, caffeinated beverages, dairy products, fruits and vegetables): Drinks water.   Breakfast: Skips; coffee Lunch: Skips Dinner: rice/beans/beef/salad      Goals    . Patient Stated     "To feel good to take care of my son"       Fall Risk Fall Risk  04/10/2018 04/05/2017 04/05/2017  Falls in the past year? 0 No No     Depression Screen PHQ 2/9 Scores 04/10/2018 10/15/2017 04/05/2017  PHQ - 2 Score 0 1 0  PHQ- 9 Score - 2 4     Cognitive Function MMSE - Mini Mental State Exam 04/10/2018  Orientation to time 5  Orientation to Place 5  Registration 3  Attention/ Calculation 5  Recall 2  Language- name 2 objects 2  Language- repeat 1  Language- follow 3 step command 3  Language- read & follow direction 1  Write a sentence 1  Copy design 1  Total score 29        Immunization History  Administered Date(s) Administered  . Influenza, High Dose Seasonal PF 01/28/2014, 05/11/2015, 03/31/2016, 01/30/2017  . Influenza, Quadrivalent, Recombinant, Inj, Pf 03/24/2013  . Pneumococcal Conjugate-13 09/10/2014  . Pneumococcal Polysaccharide-23 08/08/2012, 08/16/2017  . Zoster 09/10/2014    Screening Tests Health Maintenance  Topic Date Due  . INFLUENZA VACCINE  11/29/2017  . MAMMOGRAM  01/29/2018  . DEXA SCAN  12/13/2018  . COLONOSCOPY  07/11/2022   . Hepatitis C Screening  Completed  . PNA vac Low Risk Adult  Completed        Plan:     Shingles vaccine at pharmacy.   Schedule mammogram and bone scan.   Bring a copy of your living will and/or healthcare power of attorney to your next office visit.  Continue doing brain stimulating activities (puzzles, reading, adult coloring books, staying active) to keep memory sharp.   I have personally reviewed and noted the following in the patient's chart:   . Medical and social history . Use of alcohol, tobacco or illicit drugs  . Current medications and supplements . Functional ability and status . Nutritional status . Physical activity . Advanced directives . List of other physicians . Hospitalizations, surgeries, and ER visits in previous 12 months . Vitals . Screenings to include cognitive, depression, and falls . Referrals and appointments  In addition, I have reviewed and discussed with patient certain preventive protocols, quality metrics, and best practice recommendations. A written personalized care plan for preventive services as well as general preventive health recommendations were provided to patient.     Gerilyn Nestle, RN  04/10/2018   F/U with PCP 05/2018

## 2018-04-10 ENCOUNTER — Ambulatory Visit (INDEPENDENT_AMBULATORY_CARE_PROVIDER_SITE_OTHER): Payer: Medicare HMO

## 2018-04-10 ENCOUNTER — Other Ambulatory Visit: Payer: Self-pay

## 2018-04-10 VITALS — BP 120/60 | HR 75 | Ht 62.0 in | Wt 168.5 lb

## 2018-04-10 DIAGNOSIS — M2550 Pain in unspecified joint: Secondary | ICD-10-CM | POA: Diagnosis not present

## 2018-04-10 DIAGNOSIS — M797 Fibromyalgia: Secondary | ICD-10-CM | POA: Diagnosis not present

## 2018-04-10 DIAGNOSIS — Z23 Encounter for immunization: Secondary | ICD-10-CM | POA: Diagnosis not present

## 2018-04-10 DIAGNOSIS — E669 Obesity, unspecified: Secondary | ICD-10-CM | POA: Diagnosis not present

## 2018-04-10 DIAGNOSIS — H04123 Dry eye syndrome of bilateral lacrimal glands: Secondary | ICD-10-CM | POA: Diagnosis not present

## 2018-04-10 DIAGNOSIS — Z0000 Encounter for general adult medical examination without abnormal findings: Secondary | ICD-10-CM

## 2018-04-10 DIAGNOSIS — I7300 Raynaud's syndrome without gangrene: Secondary | ICD-10-CM | POA: Diagnosis not present

## 2018-04-10 DIAGNOSIS — E2839 Other primary ovarian failure: Secondary | ICD-10-CM | POA: Diagnosis not present

## 2018-04-10 DIAGNOSIS — Z8739 Personal history of other diseases of the musculoskeletal system and connective tissue: Secondary | ICD-10-CM | POA: Diagnosis not present

## 2018-04-10 DIAGNOSIS — Z79899 Other long term (current) drug therapy: Secondary | ICD-10-CM | POA: Diagnosis not present

## 2018-04-10 DIAGNOSIS — Z1239 Encounter for other screening for malignant neoplasm of breast: Secondary | ICD-10-CM

## 2018-04-10 DIAGNOSIS — Z5181 Encounter for therapeutic drug level monitoring: Secondary | ICD-10-CM | POA: Diagnosis not present

## 2018-04-10 MED ORDER — ZOSTER VAC RECOMB ADJUVANTED 50 MCG/0.5ML IM SUSR: 0.5000 mL | Freq: Once | INTRAMUSCULAR | 1 refills | Status: AC

## 2018-04-10 NOTE — Progress Notes (Signed)
Reviewing in PCP absence.   Maudie Mercury -- was there flu shot offered? If not this needs to be offered.   Leeanne Rio, PA-C

## 2018-04-10 NOTE — Patient Instructions (Addendum)
Shingles vaccine at pharmacy.   Schedule mammogram and bone scan.   Bring a copy of your living will and/or healthcare power of attorney to your next office visit.  Continue doing brain stimulating activities (puzzles, reading, adult coloring books, staying active) to keep memory sharp.    Health Maintenance, Female Adopting a healthy lifestyle and getting preventive care can go a long way to promote health and wellness. Talk with your health care provider about what schedule of regular examinations is right for you. This is a good chance for you to check in with your provider about disease prevention and staying healthy. In between checkups, there are plenty of things you can do on your own. Experts have done a lot of research about which lifestyle changes and preventive measures are most likely to keep you healthy. Ask your health care provider for more information. Weight and diet Eat a healthy diet  Be sure to include plenty of vegetables, fruits, low-fat dairy products, and lean protein.  Do not eat a lot of foods high in solid fats, added sugars, or salt.  Get regular exercise. This is one of the most important things you can do for your health. ? Most adults should exercise for at least 150 minutes each week. The exercise should increase your heart rate and make you sweat (moderate-intensity exercise). ? Most adults should also do strengthening exercises at least twice a week. This is in addition to the moderate-intensity exercise.  Maintain a healthy weight  Body mass index (BMI) is a measurement that can be used to identify possible weight problems. It estimates body fat based on height and weight. Your health care provider can help determine your BMI and help you achieve or maintain a healthy weight.  For females 79 years of age and older: ? A BMI below 18.5 is considered underweight. ? A BMI of 18.5 to 24.9 is normal. ? A BMI of 25 to 29.9 is considered overweight. ? A BMI  of 30 and above is considered obese.  Watch levels of cholesterol and blood lipids  You should start having your blood tested for lipids and cholesterol at 70 years of age, then have this test every 5 years.  You may need to have your cholesterol levels checked more often if: ? Your lipid or cholesterol levels are high. ? You are older than 70 years of age. ? You are at high risk for heart disease.  Cancer screening Lung Cancer  Lung cancer screening is recommended for adults 40-29 years old who are at high risk for lung cancer because of a history of smoking.  A yearly low-dose CT scan of the lungs is recommended for people who: ? Currently smoke. ? Have quit within the past 15 years. ? Have at least a 30-pack-year history of smoking. A pack year is smoking an average of one pack of cigarettes a day for 1 year.  Yearly screening should continue until it has been 15 years since you quit.  Yearly screening should stop if you develop a health problem that would prevent you from having lung cancer treatment.  Breast Cancer  Practice breast self-awareness. This means understanding how your breasts normally appear and feel.  It also means doing regular breast self-exams. Let your health care provider know about any changes, no matter how small.  If you are in your 20s or 30s, you should have a clinical breast exam (CBE) by a health care provider every 1-3 years as part of a  regular health exam.  If you are 40 or older, have a CBE every year. Also consider having a breast X-ray (mammogram) every year.  If you have a family history of breast cancer, talk to your health care provider about genetic screening.  If you are at high risk for breast cancer, talk to your health care provider about having an MRI and a mammogram every year.  Breast cancer gene (BRCA) assessment is recommended for women who have family members with BRCA-related cancers. BRCA-related cancers  include: ? Breast. ? Ovarian. ? Tubal. ? Peritoneal cancers.  Results of the assessment will determine the need for genetic counseling and BRCA1 and BRCA2 testing.  Cervical Cancer Your health care provider may recommend that you be screened regularly for cancer of the pelvic organs (ovaries, uterus, and vagina). This screening involves a pelvic examination, including checking for microscopic changes to the surface of your cervix (Pap test). You may be encouraged to have this screening done every 3 years, beginning at age 74.  For women ages 34-65, health care providers may recommend pelvic exams and Pap testing every 3 years, or they may recommend the Pap and pelvic exam, combined with testing for human papilloma virus (HPV), every 5 years. Some types of HPV increase your risk of cervical cancer. Testing for HPV may also be done on women of any age with unclear Pap test results.  Other health care providers may not recommend any screening for nonpregnant women who are considered low risk for pelvic cancer and who do not have symptoms. Ask your health care provider if a screening pelvic exam is right for you.  If you have had past treatment for cervical cancer or a condition that could lead to cancer, you need Pap tests and screening for cancer for at least 20 years after your treatment. If Pap tests have been discontinued, your risk factors (such as having a new sexual partner) need to be reassessed to determine if screening should resume. Some women have medical problems that increase the chance of getting cervical cancer. In these cases, your health care provider may recommend more frequent screening and Pap tests.  Colorectal Cancer  This type of cancer can be detected and often prevented.  Routine colorectal cancer screening usually begins at 70 years of age and continues through 70 years of age.  Your health care provider may recommend screening at an earlier age if you have risk factors  for colon cancer.  Your health care provider may also recommend using home test kits to check for hidden blood in the stool.  A small camera at the end of a tube can be used to examine your colon directly (sigmoidoscopy or colonoscopy). This is done to check for the earliest forms of colorectal cancer.  Routine screening usually begins at age 70.  Direct examination of the colon should be repeated every 5-10 years through 70 years of age. However, you may need to be screened more often if early forms of precancerous polyps or small growths are found.  Skin Cancer  Check your skin from head to toe regularly.  Tell your health care provider about any new moles or changes in moles, especially if there is a change in a mole's shape or color.  Also tell your health care provider if you have a mole that is larger than the size of a pencil eraser.  Always use sunscreen. Apply sunscreen liberally and repeatedly throughout the day.  Protect yourself by wearing long sleeves, pants,  a wide-brimmed hat, and sunglasses whenever you are outside.  Heart disease, diabetes, and high blood pressure  High blood pressure causes heart disease and increases the risk of stroke. High blood pressure is more likely to develop in: ? People who have blood pressure in the high end of the normal range (130-139/85-89 mm Hg). ? People who are overweight or obese. ? People who are African American.  If you are 82-70 years of age, have your blood pressure checked every 3-5 years. If you are 56 years of age or older, have your blood pressure checked every year. You should have your blood pressure measured twice-once when you are at a hospital or clinic, and once when you are not at a hospital or clinic. Record the average of the two measurements. To check your blood pressure when you are not at a hospital or clinic, you can use: ? An automated blood pressure machine at a pharmacy. ? A home blood pressure monitor.  If  you are between 62 years and 4 years old, ask your health care provider if you should take aspirin to prevent strokes.  Have regular diabetes screenings. This involves taking a blood sample to check your fasting blood sugar level. ? If you are at a normal weight and have a low risk for diabetes, have this test once every three years after 70 years of age. ? If you are overweight and have a high risk for diabetes, consider being tested at a younger age or more often. Preventing infection Hepatitis B  If you have a higher risk for hepatitis B, you should be screened for this virus. You are considered at high risk for hepatitis B if: ? You were born in a country where hepatitis B is common. Ask your health care provider which countries are considered high risk. ? Your parents were born in a high-risk country, and you have not been immunized against hepatitis B (hepatitis B vaccine). ? You have HIV or AIDS. ? You use needles to inject street drugs. ? You live with someone who has hepatitis B. ? You have had sex with someone who has hepatitis B. ? You get hemodialysis treatment. ? You take certain medicines for conditions, including cancer, organ transplantation, and autoimmune conditions.  Hepatitis C  Blood testing is recommended for: ? Everyone born from [birth year] through 1963. ? Anyone with known risk factors for hepatitis C.  Sexually transmitted infections (STIs)  You should be screened for sexually transmitted infections (STIs) including gonorrhea and chlamydia if: ? You are sexually active and are younger than 70 years of age. ? You are older than 71 years of age and your health care provider tells you that you are at risk for this type of infection. ? Your sexual activity has changed since you were last screened and you are at an increased risk for chlamydia or gonorrhea. Ask your health care provider if you are at risk.  If you do not have HIV, but are at risk, it may be recommended  that you take a prescription medicine daily to prevent HIV infection. This is called pre-exposure prophylaxis (PrEP). You are considered at risk if: ? You are sexually active and do not regularly use condoms or know the HIV status of your partner(s). ? You take drugs by injection. ? You are sexually active with a partner who has HIV.  Talk with your health care provider about whether you are at high risk of being infected with HIV. If you choose  to begin PrEP, you should first be tested for HIV. You should then be tested every 3 months for as long as you are taking PrEP. Pregnancy  If you are premenopausal and you may become pregnant, ask your health care provider about preconception counseling.  If you may become pregnant, take 400 to 800 micrograms (mcg) of folic acid every day.  If you want to prevent pregnancy, talk to your health care provider about birth control (contraception). Osteoporosis and menopause  Osteoporosis is a disease in which the bones lose minerals and strength with aging. This can result in serious bone fractures. Your risk for osteoporosis can be identified using a bone density scan.  If you are 26 years of age or older, or if you are at risk for osteoporosis and fractures, ask your health care provider if you should be screened.  Ask your health care provider whether you should take a calcium or vitamin D supplement to lower your risk for osteoporosis.  Menopause may have certain physical symptoms and risks.  Hormone replacement therapy may reduce some of these symptoms and risks. Talk to your health care provider about whether hormone replacement therapy is right for you. Follow these instructions at home:  Schedule regular health, dental, and eye exams.  Stay current with your immunizations.  Do not use any tobacco products including cigarettes, chewing tobacco, or electronic cigarettes.  If you are pregnant, do not drink alcohol.  If you are  breastfeeding, limit how much and how often you drink alcohol.  Limit alcohol intake to no more than 1 drink per day for nonpregnant women. One drink equals 12 ounces of beer, 5 ounces of wine, or 1 ounces of hard liquor.  Do not use street drugs.  Do not share needles.  Ask your health care provider for help if you need support or information about quitting drugs.  Tell your health care provider if you often feel depressed.  Tell your health care provider if you have ever been abused or do not feel safe at home. This information is not intended to replace advice given to you by your health care provider. Make sure you discuss any questions you have with your health care provider. Document Released: 10/31/2010 Document Revised: 09/23/2015 Document Reviewed: 01/19/2015 [employer]vier Interactive Patient Education  [employer].

## 2018-04-11 NOTE — Progress Notes (Signed)
I have reviewed the documentation from the recent AWV done by Roderic Ovens; I agree with the documentation and will follow up on any recommendations or abnormal findings as suggested.

## 2018-05-06 ENCOUNTER — Encounter (HOSPITAL_COMMUNITY): Payer: Self-pay | Admitting: Emergency Medicine

## 2018-05-06 ENCOUNTER — Other Ambulatory Visit: Payer: Self-pay

## 2018-05-06 ENCOUNTER — Emergency Department (HOSPITAL_COMMUNITY): Payer: Medicare HMO

## 2018-05-06 ENCOUNTER — Emergency Department (HOSPITAL_COMMUNITY)
Admission: EM | Admit: 2018-05-06 | Discharge: 2018-05-06 | Disposition: A | Discharge status: Home / Self Care | Payer: Medicare HMO | Attending: Emergency Medicine | Admitting: Emergency Medicine

## 2018-05-06 DIAGNOSIS — R109 Unspecified abdominal pain: Secondary | ICD-10-CM

## 2018-05-06 DIAGNOSIS — J45909 Unspecified asthma, uncomplicated: Secondary | ICD-10-CM | POA: Insufficient documentation

## 2018-05-06 DIAGNOSIS — Z79899 Other long term (current) drug therapy: Secondary | ICD-10-CM | POA: Diagnosis not present

## 2018-05-06 DIAGNOSIS — R197 Diarrhea, unspecified: Secondary | ICD-10-CM | POA: Diagnosis not present

## 2018-05-06 DIAGNOSIS — R1013 Epigastric pain: Secondary | ICD-10-CM | POA: Insufficient documentation

## 2018-05-06 DIAGNOSIS — R1033 Periumbilical pain: Secondary | ICD-10-CM | POA: Diagnosis not present

## 2018-05-06 DIAGNOSIS — R110 Nausea: Secondary | ICD-10-CM | POA: Diagnosis not present

## 2018-05-06 DIAGNOSIS — M549 Dorsalgia, unspecified: Secondary | ICD-10-CM | POA: Insufficient documentation

## 2018-05-06 DIAGNOSIS — E039 Hypothyroidism, unspecified: Secondary | ICD-10-CM | POA: Insufficient documentation

## 2018-05-06 DIAGNOSIS — K449 Diaphragmatic hernia without obstruction or gangrene: Secondary | ICD-10-CM | POA: Diagnosis not present

## 2018-05-06 LAB — CBC
HEMATOCRIT: 47.6 % — AB (ref 36.0–46.0)
Hemoglobin: 15.1 g/dL — ABNORMAL HIGH (ref 12.0–15.0)
MCH: 29.2 pg (ref 26.0–34.0)
MCHC: 31.7 g/dL (ref 30.0–36.0)
MCV: 92.1 fL (ref 80.0–100.0)
Platelets: 269 10*3/uL (ref 150–400)
RBC: 5.17 MIL/uL — ABNORMAL HIGH (ref 3.87–5.11)
RDW: 13.2 % (ref 11.5–15.5)
WBC: 8.3 10*3/uL (ref 4.0–10.5)
nRBC: 0 % (ref 0.0–0.2)

## 2018-05-06 LAB — URINALYSIS, ROUTINE W REFLEX MICROSCOPIC
Bilirubin Urine: NEGATIVE
Glucose, UA: NEGATIVE mg/dL
Hgb urine dipstick: NEGATIVE
Ketones, ur: 20 mg/dL — AB
Leukocytes, UA: NEGATIVE
Nitrite: NEGATIVE
Protein, ur: NEGATIVE mg/dL
Specific Gravity, Urine: 1.01 (ref 1.005–1.030)
pH: 6 (ref 5.0–8.0)

## 2018-05-06 LAB — COMPREHENSIVE METABOLIC PANEL
ALT: 19 U/L (ref 0–44)
AST: 21 U/L (ref 15–41)
Albumin: 4.2 g/dL (ref 3.5–5.0)
Alkaline Phosphatase: 70 U/L (ref 38–126)
Anion gap: 10 (ref 5–15)
BUN: 11 mg/dL (ref 8–23)
CHLORIDE: 106 mmol/L (ref 98–111)
CO2: 22 mmol/L (ref 22–32)
Calcium: 9.1 mg/dL (ref 8.9–10.3)
Creatinine, Ser: 0.78 mg/dL (ref 0.44–1.00)
GFR calc Af Amer: >60 mL/min (ref >60)
GFR calc non Af Amer: >60 mL/min (ref >60)
Glucose, Bld: 85 mg/dL (ref 70–99)
Potassium: 3.9 mmol/L (ref 3.5–5.1)
Sodium: 138 mmol/L (ref 135–145)
Total Bilirubin: 0.5 mg/dL (ref 0.3–1.2)
Total Protein: 7.6 g/dL (ref 6.5–8.1)

## 2018-05-06 LAB — I-STAT TROPONIN, ED: Troponin i, poc: 0.01 ng/mL (ref 0.00–0.08)

## 2018-05-06 LAB — LIPASE, BLOOD: Lipase: 48 U/L (ref 11–51)

## 2018-05-06 MED ORDER — SUCRALFATE 1 G PO TABS: 1.0000 g | ORAL_TABLET | Freq: Three times a day (TID) | ORAL | 0 refills | Status: AC

## 2018-05-06 MED ORDER — MORPHINE SULFATE (PF) 4 MG/ML IV SOLN
4.0000 mg | Freq: Once | INTRAVENOUS | Status: AC
  Administered 2018-05-06: 4 mg via INTRAVENOUS
  Filled 2018-05-06: qty 1

## 2018-05-06 MED ORDER — FAMOTIDINE IN NACL 20-0.9 MG/50ML-% IV SOLN
20.0000 mg | Freq: Once | INTRAVENOUS | Status: AC
  Administered 2018-05-06: 20 mg via INTRAVENOUS
  Filled 2018-05-06: qty 50

## 2018-05-06 MED ORDER — SUCRALFATE 1 G PO TABS
1.0000 g | ORAL_TABLET | Freq: Once | ORAL | Status: AC
  Administered 2018-05-06: 1 g via ORAL
  Filled 2018-05-06: qty 1

## 2018-05-06 MED ORDER — IOPAMIDOL (ISOVUE-300) INJECTION 61%
100.0000 mL | Freq: Once | INTRAVENOUS | Status: AC | PRN
  Administered 2018-05-06: 100 mL via INTRAVENOUS

## 2018-05-06 MED ORDER — SODIUM CHLORIDE 0.9 % IV BOLUS
1000.0000 mL | Freq: Once | INTRAVENOUS | Status: AC
  Administered 2018-05-06: 1000 mL via INTRAVENOUS

## 2018-05-06 MED ORDER — ONDANSETRON HCL 4 MG/2ML IJ SOLN
4.0000 mg | Freq: Once | INTRAMUSCULAR | Status: AC
  Administered 2018-05-06: 4 mg via INTRAVENOUS
  Filled 2018-05-06: qty 2

## 2018-05-06 NOTE — ED Provider Notes (Signed)
[facility] DEPT Provider Note   CSN: [id number] Arrival date & time: 05/06/18  1257     History   Chief Complaint Chief Complaint  Patient presents with  . Abdominal Pain  . Diarrhea  . Back Pain   Pt is spanish speaking. Offered translator however patient declines. She would prefer her husband to translate.   HPI Allison Grant is a 73 y.o. female.  HPI   Patient is a 73 year old female with a history of asthma, celiac sprue, colon adenoma, diverticulosis, fibromyalgia, GERD, osteopenia, sciatica, IBS, who presents the emergency department today complaining abdominal pain, diarrhea and back pain.  States she has had epigastric/periumbilical abd pain that has also been present constantly but is worse when she eats.HAs been present for the same time period. Pain rated 9-10/10. Pain feels like a burning sensation and now it feels like a stabbing pain.  Pain does not radiate. Reports nausea, no vomiting. No urinary sxs. No fevers.  She has had similar pain in the past and follows with GI at [facility]. She currently sees [place], [place].  She had colonoscopy 07/10/17 for evaluation of chronic diarrhea and was found to have normal mucosa. She had biopsies that r/o microscopic colitis which were negative, no viral cytophatic effects or parasites identified. States she was given robaxin, dicyclomine, and pantoprazole for her abd pain. She had EGD 12/2015 that showed esophageal dysmotility with presbyesophagus and moderate GERD. Small hiatal hernia. Mild mucosal fold thickening with mild esophagitis.   Pt has been having diarrhea for the last 6 days. Pt has had at least 10 episodes of diarrhea per day. No bloody stools. She had antibiotics from her dentist about 1 month ago. No recent trips out of the country. No sick contacts.  She also c/o back pain that is chronic and unchanged today. No numbness/weakness to the legs. She has had loss of control of her bowels  intermittently however this has been ongoing intermittently for the last several years. This is confirmed in her note with GI. No urinary retention. No falls or trauma.  Past Medical History:  Diagnosis Date  . Arthritis   . Asthma   . Cataracts, bilateral   . 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 Depression   . Disease of thyroid gland   . Diverticulosis   . Fibromyalgia   . Fracture of left ankle 05/2012  . GERD (gastroesophageal reflux disease)   . Migraine   . Osteopenia   . Personal history of calcium pyrophosphate deposition disease (CPPD)   . Sciatica     Patient Active Problem List   Diagnosis Date Noted  . Chronic pain syndrome 05/14/2017  . Chronic narcotic use 05/14/2017  . Insomnia due to medical condition 05/14/2017  . Personal history of calcium pyrophosphate deposition disease (CPPD) 11/12/2015  . Colon adenoma 08/02/2015  . Fibromyalgia 12/02/2014  . Chronic recurrent major depressive disorder ([place]) 12/02/2014  . Osteopenia 11/21/2013  . Hypothyroidism 06/24/2013  . DDD (degenerative disc disease), lumbar 11/06/2012  . OA (osteoarthritis) 10/23/2012  . Celiac sprue 08/08/2012  . Diverticulosis 08/08/2012  . GERD (gastroesophageal reflux disease) 08/08/2012  . Moderate persistent asthma without complication 32/67/1245  . Sciatica 08/08/2012    Past Surgical History:  Procedure Laterality Date  . ABDOMINAL HYSTERECTOMY    . BREAST EXCISIONAL BIOPSY Left 1989   benign  . CATARACT EXTRACTION W/ INTRAOCULAR LENS IMPLANT  Left   . COLON SURGERY     March 2019  . FOOT SURGERY    . KNEE SURGERY     Right knee 05/04/2017  . TUBAL LIGATION       OB History   No obstetric history on file.      Home Medications    Prior to Admission medications   Medication Sig Start Date End Date Taking? Authorizing Provider  alum & mag hydroxide-simeth (MAALOX/MYLANTA) 200-200-20 MG/5ML  suspension Take 15 mLs by mouth every 6 (six) hours as needed for indigestion or heartburn.   Yes [provider]  bismuth subsalicylate (PEPTO BISMOL) 262 MG/15ML suspension Take 15 mLs by mouth every 6 (six) hours as needed for indigestion.   Yes [provider]  clonazePAM (KLONOPIN) 1 MG tablet TAKE 1 TABLET BY MOUTH TWICE DAILY AS NEEDED FOR ANXIETY Patient taking differently: Take 2 mg by mouth at bedtime.  12/11/17  Yes Leamon Arnt, MD  dicyclomine (BENTYL) 10 MG capsule Take 10 mg by mouth every 2 (two) hours as needed for spasms.  11/27/16  Yes [provider]  doxepin (SINEQUAN) 10 MG capsule TAKE 1 CAPSULE BY MOUTH AT BEDTIME Patient taking differently: Take 10 mg by mouth at bedtime.  11/05/17  Yes Leamon Arnt, MD  fluticasone (FLONASE) 50 MCG/ACT nasal spray Place 2 sprays into both nostrils daily. Patient taking differently: Place 2 sprays into both nostrils daily as needed for allergies.  08/16/17  Yes Leamon Arnt, MD  levothyroxine (SYNTHROID, LEVOTHROID) 25 MCG tablet Take 1 tablet (25 mcg total) by mouth daily before breakfast. 07/24/17  Yes Leamon Arnt, MD  loperamide (IMODIUM) 2 MG capsule Take 2 mg by mouth 2 (two) times daily as needed for diarrhea or loose stools.   Yes [provider]  methocarbamol (ROBAXIN) 750 MG tablet Take 750 mg by mouth 2 (two) times daily.   Yes [provider]  pantoprazole (PROTONIX) 40 MG tablet Take 40 mg by mouth 2 (two) times daily.  08/30/17 08/30/18 Yes [provider]  Polyethylene Glycol 3350 GRAN Take 17 g by mouth daily as needed (constipation). Dissolve in 8 oz of fluid and drink   Yes [provider]  pregabalin (LYRICA) 200 MG capsule Take 200 mg by mouth 3 (three) times daily.   Yes [provider]  HYDROcodone-acetaminophen (NORCO/VICODIN) 5-325 MG tablet Take 1-2 tablets by mouth 2 (two) times daily as needed for moderate pain. Patient not taking: Reported  on 04/10/2018 10/15/17   Leamon Arnt, MD  lansoprazole (PREVACID) 30 MG capsule Take 1 capsule (30 mg total) by mouth 2 (two) times daily before a meal. Patient not taking: Reported on 10/15/2017 06/25/17   Leamon Arnt, MD  sucralfate (CARAFATE) 1 g tablet Take 1 tablet (1 g total) by mouth 4 (four) times daily -  with meals and at bedtime for 14 days. 05/06/18 05/20/18  Dionne Rossa S, PA-C    Family History Family History  Problem Relation Age of Onset  . Arthritis Mother   . Asthma Mother   . Heart disease Mother   . Hypertension Mother   . Mental illness Mother   . Thyroid disease Mother   . Emphysema Father   . Hyperlipidemia Father   . Asthma Sister   . Seizures Sister   . Cancer Paternal Grandmother   . Diabetes Paternal Grandmother   . Hypertension Paternal Grandmother   . Diabetes Son   . Diabetes Son   .  Asthma Son     Social History Social History   Tobacco Use  . Smoking status: Never Smoker  . Smokeless tobacco: Never Used  Substance Use Topics  . Alcohol use: No    Frequency: Never  . Drug use: No     Allergies   Celecoxib   Review of Systems Review of Systems  Constitutional: Negative for fever.  HENT: Negative for ear pain and sore throat.   Eyes: Negative for visual disturbance.  Respiratory: Negative for cough and shortness of breath.   Cardiovascular: Negative for chest pain.  Gastrointestinal: Positive for abdominal pain, diarrhea and nausea. Negative for blood in stool, constipation and vomiting.       Chronic bowel incontinence  Genitourinary: Negative for dysuria, hematuria and urgency.       No loss of control of bladder function  Musculoskeletal: Positive for back pain (chronic).  Skin: Negative for rash.  Neurological: Negative for weakness, numbness and headaches.  All other systems reviewed and are negative.    Physical Exam Updated Vital Signs BP (!) 103/50   Pulse 65   Temp 97.9 F (36.6 C) (Oral)   Resp 13   SpO2  94%   Physical Exam Vitals signs and nursing note reviewed.  Constitutional:      General: She is not in acute distress.    Appearance: She is well-developed.  HENT:     Head: Normocephalic and atraumatic.     Nose: Nose normal.     Mouth/Throat:     Mouth: Mucous membranes are dry.  Eyes:     Conjunctiva/sclera: Conjunctivae normal.  Neck:     Musculoskeletal: Neck supple.  Cardiovascular:     Rate and Rhythm: Normal rate and regular rhythm.     Heart sounds: No murmur.  Pulmonary:     Effort: Pulmonary effort is normal. No respiratory distress.     Breath sounds: Normal breath sounds.  Abdominal:     General: Bowel sounds are normal.     Palpations: Abdomen is soft.     Tenderness: There is abdominal tenderness in the epigastric area and periumbilical area. There is no right CVA tenderness, left CVA tenderness, guarding or rebound.  Skin:    General: Skin is warm and dry.  Neurological:     General: No focal deficit present.     Mental Status: She is alert.  Psychiatric:        Mood and Affect: Mood normal.      ED Treatments / Results  Labs (all labs ordered are listed, but only abnormal results are displayed) Labs Reviewed  CBC - Abnormal; Notable for the following components:      Result Value   RBC 5.17 (*)    Hemoglobin 15.1 (*)    HCT 47.6 (*)    All other components within normal limits  URINALYSIS, ROUTINE W REFLEX MICROSCOPIC - Abnormal; Notable for the following components:   Ketones, ur 20 (*)    All other components within normal limits  C DIFFICILE QUICK SCREEN W PCR REFLEX  LIPASE, BLOOD  COMPREHENSIVE METABOLIC PANEL  I-STAT TROPONIN, ED    EKG None  Radiology Dg Chest 2 View  Result Date: 05/06/2018 CLINICAL DATA:  Abdominal pain and back pain. Epigastric abdominal pain. EXAM: CHEST - 2 VIEW COMPARISON:  None. FINDINGS: The heart size and mediastinal contours are within normal limits. No airspace opacities. Calcified granuloma  identified in the left lower lobe. The visualized skeletal structures are unremarkable. IMPRESSION: 1.  No acute cardiopulmonary abnormalities. Electronically Signed   By: Kerby Moors M.D.   On: 05/06/2018 16:45   Ct Abdomen Pelvis W Contrast  Result Date: 05/06/2018 CLINICAL DATA:  Generalized abdominal pain with diarrhea. EXAM: CT ABDOMEN AND PELVIS WITH CONTRAST TECHNIQUE: Multidetector CT imaging of the abdomen and pelvis was performed using the standard protocol following bolus administration of intravenous contrast. CONTRAST:  151m ISOVUE-300 IOPAMIDOL (ISOVUE-300) INJECTION 61% COMPARISON:  None. FINDINGS: Lower chest: Small hiatal hernia. Otherwise normal. Hepatobiliary: Focal fatty infiltration and in the left lobe adjacent to the falciform ligament. Hepatic parenchyma is otherwise normal. Cholecystectomy. No dilated bile ducts. Pancreas: Unremarkable. No pancreatic ductal dilatation or surrounding inflammatory changes. Spleen: Normal in size without focal abnormality. Adrenals/Urinary Tract: Adrenal glands are unremarkable. Kidneys are normal, without renal calculi, focal lesion, or hydronephrosis. Bladder is unremarkable. Stomach/Bowel: Stomach is within normal limits except for a small hiatal hernia. Appendix appears normal. No evidence of bowel wall thickening, distention, or inflammatory changes. Vascular/Lymphatic: No significant vascular findings are present. No enlarged abdominal or pelvic lymph nodes. Reproductive: Status post hysterectomy. No adnexal masses. Other: No abdominal wall hernia or abnormality. No abdominopelvic ascites. Musculoskeletal: No acute or significant osseous findings. IMPRESSION: No acute abnormalities. Small hiatal hernia. Electronically Signed   By: JLorriane ShireM.D.   On: 05/06/2018 16:30    Procedures Procedures (including critical care time)  Medications Ordered in ED Medications  sodium chloride 0.9 % bolus 1,000 mL (1,000 mLs Intravenous New Bag/Given  05/06/18 1648)  famotidine (PEPCID) IVPB 20 mg premix (0 mg Intravenous Stopped 05/06/18 1815)  sucralfate (CARAFATE) tablet 1 g (1 g Oral Given 05/06/18 1650)  ondansetron (ZOFRAN) injection 4 mg (4 mg Intravenous Given 05/06/18 1650)  iopamidol (ISOVUE-300) 61 % injection 100 mL (100 mLs Intravenous Contrast Given 05/06/18 1558)  morphine 4 MG/ML injection 4 mg (4 mg Intravenous Given 05/06/18 1852)     Initial Impression / Assessment and Plan / ED Course  I have reviewed the triage vital signs and the nursing notes.  Pertinent labs & imaging results that were available during my care of the patient were reviewed by me and considered in my medical decision making (see chart for details).  Final Clinical Impressions(s) / ED Diagnoses   Final diagnoses:  Abdominal pain, unspecified abdominal location  Diarrhea, unspecified type   73year old female with a history of IBS, celiac sprue, chronic diarrhea (per chart review), fibromyalgia with chronic back pain, presenting the ED today complaining of abdominal pain, diarrhea and back pain.  Back pain: Patient reports is chronic and unchanged today.  She has chronic fecal incontinence at baseline but otherwise no red flag signs or symptoms of pain.  Normal strength to lower extremities.  No neuro deficits.  She can continue to manage her symptoms as an outpatient as she does currently.  Abdominal pain and diarrhea: Diarrhea and abdominal pain have been present for the last 6 days.  Abdominal pain located in epigastrically and periumbilically and is worse with eating.  She is already on Protonix for this.  She has had an EGD in the past which showed esophagitis but no PUD.  She has not had EGD since 2017 per chart review.  She does follow chronically with GI.  On exam has some mild epigastric tenderness as well as right upper quadrant left upper quadrant tenderness.  She recently had antibiotics for dental infection.  Will check labs, C. difficile, chest x-ray  EKG and troponin given her epigastric pain.  CBC without leukocytosis, hemoglobin slightly elevated suggesting hemoconcentration.  CMP with normal electrolytes normal kidney and liver function.  Lipase is negative.  UA shows ketonuria but no other abnormalities. Trop negative. Cdiff unable to be collected after several hours of observation and treatment in the emergency department, makes this less likely to be related to C. difficile however will give her specimen cup so that she can collect this and taken to her PCP or GI doctor.  EKG with NSR, no ischemic changes. Borderline prolonged QT.   CXR with no acute abnormality  CT abd/pelvis showed evidence of a hiatal hernia but no other acute abdominal pathology.  Patient hydrated with IV fluids, she was given Pepcid, Zofran.  Patient able to tolerate p.o. in the ED without difficulty.  Repeat abdominal exam showed mild periumbilical tenderness but again no rebound, rigidity or guarding to suggest surgical abdomen at this time.  Suspect patient symptoms may be related to her underlying chronic GI diagnoses, I have advised her to follow-up with her GI doctor tomorrow for further evaluation.  Advised her to return to the ER for new or worsening symptoms.  Will give Rx for Carafate as it did improve her symptoms here.  Advised her to return if worse.  She voiced understanding the plan was to return the ED.  All questions answered.  ED Discharge Orders         Ordered    sucralfate (CARAFATE) 1 g tablet  3 times daily with meals & bedtime     05/06/18 1943           Bishop Dublin 05/06/18 1946    Tegeler, Gwenyth Allegra, MD 05/07/18 [phone]

## 2018-05-06 NOTE — Discharge Instructions (Addendum)
You will be given a medication to help with your symptoms.  You need to call your gastroenterology doctor tomorrow morning to make an appointment for follow-up in regards to your continued diarrhea and abdominal pain.   Please collect a stool sample and take it to your primary care doctor or your GI doctor to be evaluated for C. difficile.   If your abdominal pain worsens in any way then you will need to return to the emergency department.

## 2018-05-06 NOTE — ED Triage Notes (Signed)
Pt c/o n/d, abd pains and back pains for 6 days. Reports has taken Imodium, Mylanta, pap to without any relief. Denies urinary problems.

## 2018-05-07 ENCOUNTER — Encounter: Payer: Self-pay | Admitting: Family Medicine

## 2018-05-07 ENCOUNTER — Ambulatory Visit (INDEPENDENT_AMBULATORY_CARE_PROVIDER_SITE_OTHER): Payer: Medicare HMO | Admitting: Family Medicine

## 2018-05-07 VITALS — BP 118/78 | HR 68 | Temp 98.7°F | Resp 16 | Ht 62.0 in | Wt 169.4 lb

## 2018-05-07 DIAGNOSIS — R197 Diarrhea, unspecified: Secondary | ICD-10-CM | POA: Diagnosis not present

## 2018-05-07 DIAGNOSIS — M797 Fibromyalgia: Secondary | ICD-10-CM

## 2018-05-07 DIAGNOSIS — M8580 Other specified disorders of bone density and structure, unspecified site: Secondary | ICD-10-CM

## 2018-05-07 DIAGNOSIS — F339 Major depressive disorder, recurrent, unspecified: Secondary | ICD-10-CM

## 2018-05-07 DIAGNOSIS — G894 Chronic pain syndrome: Secondary | ICD-10-CM | POA: Diagnosis not present

## 2018-05-07 DIAGNOSIS — E039 Hypothyroidism, unspecified: Secondary | ICD-10-CM

## 2018-05-07 DIAGNOSIS — E559 Vitamin D deficiency, unspecified: Secondary | ICD-10-CM

## 2018-05-07 DIAGNOSIS — R110 Nausea: Secondary | ICD-10-CM | POA: Diagnosis not present

## 2018-05-07 DIAGNOSIS — R1013 Epigastric pain: Secondary | ICD-10-CM | POA: Diagnosis not present

## 2018-05-07 DIAGNOSIS — Z0000 Encounter for general adult medical examination without abnormal findings: Secondary | ICD-10-CM | POA: Diagnosis not present

## 2018-05-07 DIAGNOSIS — R69 Illness, unspecified: Secondary | ICD-10-CM | POA: Diagnosis not present

## 2018-05-07 LAB — LIPID PANEL
Cholesterol: 195 mg/dL (ref 0–200)
HDL: 56.5 mg/dL (ref >39.00)
LDL Cholesterol: 124 mg/dL — ABNORMAL HIGH (ref 0–99)
NonHDL: 138.05
Total CHOL/HDL Ratio: 3
Triglycerides: 72 mg/dL (ref 0.0–149.0)
VLDL: 14.4 mg/dL (ref 0.0–40.0)

## 2018-05-07 LAB — VITAMIN D 25 HYDROXY (VIT D DEFICIENCY, FRACTURES): VITD: 27.1 ng/mL — ABNORMAL LOW (ref 30.00–100.00)

## 2018-05-07 LAB — TSH: TSH: 2.85 u[IU]/mL (ref 0.35–4.50)

## 2018-05-07 MED ORDER — CLONAZEPAM 1 MG PO TABS: ORAL_TABLET | ORAL | 5 refills | Status: DC

## 2018-05-07 NOTE — Progress Notes (Signed)
Subjective  Chief Complaint  Patient presents with  . Annual Exam  . Follow-up    Was seen in the ER yesterday for Abdominal pain and diarrhea   Prefer not feeds likely due to diarrhea HPI: Allison Grant is a 73 y.o. female who presents to [facility] today for a Female Wellness Visit. She also has the concerns and/or needs as listed above in the chief complaint. These will be addressed in addition to the Health Maintenance Visit.   Wellness Visit: annual visit with health maintenance review and exam without Pap   HM: has dexa and mammo scheduled for February. imms are up to date. AWV was in December.   Chronic disease f/u and/or acute problem visit: (deemed necessary to be done in addition to the wellness visit):  F/u ER: back and abdominal pain; negative eval. Chronic recurring problems. Reviewed notes, imaging studies and labs.   Chronic pain.  Reports she is seeing a chronic pain specialist via [facility].  I will request records.  She is taking Lyrica.  She is also seeing a rheumatologist.  I need those records as well.  Apparently it was recommended that she take Plaquenil but she does not want to because of the risk of eye problems.  Hypothyroidism: Has not taken medication for a few days but usually is compliant.   Assessment  1. Annual physical exam   2. Chronic recurrent major depressive disorder ([place])   3. Chronic pain syndrome   4. Fibromyalgia   5. Acquired hypothyroidism   6. Osteopenia, unspecified location   7. Vitamin D deficiency      Plan  Female Wellness Visit:  Age appropriate Health Maintenance and Prevention measures were discussed with patient. Included topics are cancer screening recommendations, ways to keep healthy (see AVS) including dietary and exercise recommendations, regular eye and dental care, use of seat belts, and avoidance of moderate alcohol use and tobacco use.  Await results of mammogram  and bone density in February  BMI: discussed patient's BMI and encouraged positive lifestyle modifications to help get to or maintain a target BMI.  HM needs and immunizations were addressed and ordered. See below for orders. See HM and immunization section for updates.  Routine labs and screening tests ordered including cmp, cbc and lipids where appropriate.  Discussed recommendations regarding Vit D and calcium supplementation (see AVS)  We will request records for chronic pain management and rheumatology  Chronic disease management visit and/or acute problem visit:  Recheck hypothyroidism  Chronic pain and fibromyalgia per specialist. Follow up: Return in about 1 year (around 05/08/2019) for complete physical.  Orders Placed This Encounter  Procedures  . Lipid panel  . TSH  . VITAMIN D 25 Hydroxy (Vit-D Deficiency, Fractures)   Meds ordered this encounter  Medications  . clonazePAM (KLONOPIN) 1 MG tablet    Sig: TAKE 1 TABLET BY MOUTH TWICE DAILY AS NEEDED FOR ANXIETY    Dispense:  60 tablet    Refill:  5      Lifestyle: Body mass index is 30.98 kg/m. Wt Readings from Last 3 Encounters:  05/07/18 169 lb 6.4 oz (76.8 kg)  04/10/18 168 lb 8 oz (76.4 kg)  10/15/17 166 lb 3.2 oz (75.4 kg)     Patient Active Problem List   Diagnosis Date Noted  . Chronic pain syndrome 05/14/2017    Priority: High  . Chronic narcotic use 05/14/2017    Priority: High  . Colon adenoma  08/02/2015    Priority: High    Overview:  Colonoscopy 06/2015 [facility]; repeat q 5 years.   . Fibromyalgia 12/02/2014    Priority: High  . Chronic recurrent major depressive disorder ([place]) 12/02/2014    Priority: High    Overview:  Has failed Savella, prozac, Wellbutrin, Zoloft, cymbalta in past. Does not want more meds.    . Hypothyroidism 06/24/2013    Priority: High    Overview:  On low dose synthroid   . DDD (degenerative disc disease), lumbar 11/06/2012    Priority: High    Overview:  LBP,  PINCHED NERVE   . Sciatica 08/08/2012    Priority: High  . Personal history of calcium pyrophosphate deposition disease (CPPD) 11/12/2015    Priority: Medium  . Osteopenia 11/21/2013    Priority: Medium    Overview:  DEXA 10/2015 - osteopenia T =-1.4, no change. DEXA T=-1.8 lumbar, hip T=-0.8 06/2013   . OA (osteoarthritis) 10/23/2012    Priority: Medium  . Celiac sprue 08/08/2012    Priority: Medium    Overview:  By biopsy    . GERD (gastroesophageal reflux disease) 08/08/2012    Priority: Medium  . Moderate persistent asthma without complication 16/38/4665    Priority: Medium    Overview:  2010   . Diverticulosis 08/08/2012    Priority: Low    Overview:  Colonoscopy 08/2011   . Insomnia due to medical condition 05/14/2017   Health Maintenance  Topic Date Due  . MAMMOGRAM  01/29/2018  . DEXA SCAN  12/13/2018  . COLONOSCOPY  07/11/2022  . INFLUENZA VACCINE  Completed  . Hepatitis C Screening  Completed  . PNA vac Low Risk Adult  Completed   Immunization History  Administered Date(s) Administered  . Influenza, High Dose Seasonal PF 01/28/2014, 05/11/2015, 03/31/2016, 01/30/2017, 04/10/2018  . Influenza, Quadrivalent, Recombinant, Inj, Pf 03/24/2013  . Pneumococcal Conjugate-13 09/10/2014  . Pneumococcal Polysaccharide-23 08/08/2012, 08/16/2017  . Zoster 09/10/2014   We updated and reviewed the patient's past history in detail and it is documented below. Allergies: Patient is allergic to celecoxib. Past Medical History Patient  has a past medical history of Arthritis, Asthma, Cataracts, bilateral, Celiac sprue (08/08/2012), Colon adenoma (08/02/2015), Depression, Disease of thyroid gland, Diverticulosis, Fibromyalgia, Fracture of left ankle (05/2012), GERD (gastroesophageal reflux disease), Migraine, Osteopenia, Personal history of calcium pyrophosphate deposition disease (CPPD), and Sciatica. Past Surgical History Patient  has a past surgical history that includes  Breast excisional biopsy (Left, 1989); Tubal ligation; Cataract extraction w/ intraocular lens implant (Left); Foot surgery; Abdominal hysterectomy; Knee surgery; and Colon surgery. Family History: Patient family history includes Arthritis in her mother; Asthma in her mother, sister, and son; Cancer in her paternal grandmother; Diabetes in her paternal grandmother, son, and son; Emphysema in her father; Heart disease in her mother; Hyperlipidemia in her father; Hypertension in her mother and paternal grandmother; Mental illness in her mother; Seizures in her sister; Thyroid disease in her mother. Social History:  Patient  reports that she has never smoked. She has never used smokeless tobacco. She reports that she does not drink alcohol or use drugs.  Review of Systems: Constitutional: negative for fever or malaise Ophthalmic: negative for photophobia, double vision or loss of vision Cardiovascular: negative for chest pain, dyspnea on exertion, or new LE swelling Respiratory: negative for SOB or persistent cough Gastrointestinal: Positive for abdominal pain, negative for change in bowel habits or melena, has follow-up with GI Genitourinary: negative for dysuria or gross hematuria, no abnormal uterine  bleeding or disharge Musculoskeletal: negative for new gait disturbance or muscular weakness Integumentary: negative for new or persistent rashes, no breast lumps Neurological: negative for TIA or stroke symptoms Psychiatric: negative for SI or delusions Allergic/Immunologic: negative for hives  Patient Care Team    Relationship Specialty Notifications Start End  Leamon Arnt, MD PCP - General Family Medicine  04/05/17   Ortho, [facility]  Specialist  04/05/17   Manuella Ghazi  Rheumatology  04/10/18    Comment: [facility]    Objective  Vitals: BP 118/78   Pulse 68   Temp 98.7 F (37.1 C) (Oral)   Resp 16   Ht 5' 2"  (1.575 m)   Wt 169 lb 6.4 oz (76.8 kg)   SpO2 95%   BMI 30.98 kg/m  General:  Well  developed, well nourished, no acute distress  Psych:  Alert and orientedx3, flat mood and affect HEENT:  Normocephalic, atraumatic, non-icteric sclera, PERRL, oropharynx is clear without mass or exudate, supple neck without adenopathy, mass or thyromegaly Cardiovascular:  Normal S1, S2, RRR without gallop, rub or murmur, nondisplaced PMI Respiratory:  Good breath sounds bilaterally, CTAB with normal respiratory effort Gastrointestinal: normal bowel sounds, soft, mildly diffusely-tender, no noted masses. No HSM, no rebound or guarding, no masses MSK: no deformities, contusions. Joints are without erythema or swelling. Spine and CVA region are nontender Skin:  Warm, no rashes or suspicious lesions noted Neurologic:    Mental status is normal. CN 2-11 are normal. Gross motor and sensory exams are normal. Normal gait. No tremor Breast Exam: No mass, skin retraction or nipple discharge is appreciated in either breast. No axillary adenopathy. Fibrocystic changes are not noted    Commons side effects, risks, benefits, and alternatives for medications and treatment plan prescribed today were discussed, and the patient expressed understanding of the given instructions. Patient is instructed to call or message via MyChart if he/she has any questions or concerns regarding our treatment plan. No barriers to understanding were identified. We discussed Red Flag symptoms and signs in detail. Patient expressed understanding regarding what to do in case of urgent or emergency type symptoms.   Medication list was reconciled, printed and provided to the patient in AVS. Patient instructions and summary information was reviewed with the patient as documented in the AVS. This note was prepared with assistance of Dragon voice recognition software. Occasional wrong-word or sound-a-like substitutions may have occurred due to the inherent limitations of voice recognition software

## 2018-05-07 NOTE — Patient Instructions (Signed)
Please return in 12 months (January 2020) for your annual complete physical; please come fasting.  You may schedule your medicare annual wellness visit in December 2020.  If you have any questions or concerns, please don't hesitate to send me a message via MyChart or call the office at [phone]. Thank you for visiting with [place] today! It's our pleasure caring for you.  Recommendations for women to keep healthy:   EXERCISE AND DIET: We recommended that you start or continue a regular exercise program for good health. Regular exercise means any activity that makes your heart beat faster and makes you sweat. We recommend exercising at least 30 minutes per day at least 3 days a week, preferably 4 or 5. We also recommend a diet low in fat and sugar. Inactivity, poor dietary choices and obesity can cause diabetes, heart attack, stroke, and kidney damage, among others.   ALCOHOL AND SMOKING: Women should limit their alcohol intake to no more than 7 drinks/beers/glasses of wine (combined, not each!) per week. Moderation of alcohol intake to this level decreases your risk of breast cancer and liver damage. And of course, no recreational drugs are part of a healthy lifestyle. And absolutely no smoking or even second hand smoke. Most people know smoking can cause heart and lung diseases, but did you know it also contributes to weakening of your bones? Aging of your skin? Yellowing of your teeth and nails?  CALCIUM AND VITAMIN D: Adequate intake of calcium and Vitamin D are recommended. The recommendations for exact amounts of these supplements seem to change often, but generally speaking 600 mg of calcium (either carbonate or citrate) and 800 units of Vitamin D per day seems prudent. Certain women may benefit from higher intake of Vitamin D. If you are among these women, your doctor will have told you during your visit.   PAP SMEARS: Pap smears, to check for cervical cancer or precancers, have traditionally  been done yearly, although recent scientific advances have shown that most women can have pap smears less often. However, every woman still should have a physical exam from her gynecologist every year. It will include a breast check, inspection of the vulva and vagina to check for abnormal growths or skin changes, a visual exam of the cervix, and then an exam to evaluate the size and shape of the uterus and ovaries. And after 71 years of age, a rectal exam is indicated to check for rectal cancers. We will also provide age appropriate advice regarding health maintenance, like when you should have certain vaccines, screening for sexually transmitted diseases, bone density testing, colonoscopy, mammograms, etc.   MAMMOGRAMS: All women over 53 years old should have a yearly mammogram. Many facilities now offer a "3D" mammogram, which may cost around $50 extra out of pocket. If possible, we recommend you accept the option to have the 3D mammogram performed. It both reduces the number of women who will be called back for extra views which then turn out to be normal, and it is better than the routine mammogram at detecting truly abnormal areas.   COLONOSCOPY: Colonoscopy to screen for colon cancer is recommended for all women at age 81. We know, you hate the idea of the prep. We agree, BUT, having colon cancer and not knowing it is worse!! Colon cancer so often starts as a polyp that can be seen and removed at colonscopy, which can quite literally save your life! And if your first colonoscopy is normal and you have no  family history of colon cancer, most women don't have to have it again for 10 years. Once every ten years, you can do something that may end up saving your life, right? We will be happy to help you get it scheduled when you are ready. Be sure to check your insurance coverage so you understand how much it will cost. It may be covered as a preventative service at no cost, but you should check your  particular policy.

## 2018-05-08 NOTE — Progress Notes (Signed)
Please call patient: I have reviewed his/her lab results. Vit D is a little low; I recommend taking otc Vit D 2000 units daily. This is to protect her bones and prevent cramping. Thyroid levels are normal. Cholesterol levels are borderline. We will recheck in 1 year. No need to start medication for that at this point.  The 10-year ASCVD risk score Mikey Bussing DC Brooke Bonito., et al., 2013) is: 8%   Values used to calculate the score:     Age: 73 years     Sex: Female     Is Non-Hispanic African American: No     Diabetic: No     Tobacco smoker: No     Systolic Blood Pressure: 707 mmHg     Is BP treated: No     HDL Cholesterol: 56.5 mg/dL     Total Cholesterol: 195 mg/dL

## 2018-05-13 ENCOUNTER — Encounter: Payer: Self-pay | Admitting: *Deleted

## 2018-05-13 DIAGNOSIS — E039 Hypothyroidism, unspecified: Secondary | ICD-10-CM | POA: Diagnosis not present

## 2018-05-13 DIAGNOSIS — E669 Obesity, unspecified: Secondary | ICD-10-CM | POA: Diagnosis not present

## 2018-05-13 DIAGNOSIS — R69 Illness, unspecified: Secondary | ICD-10-CM | POA: Diagnosis not present

## 2018-05-13 DIAGNOSIS — G8929 Other chronic pain: Secondary | ICD-10-CM | POA: Diagnosis not present

## 2018-05-13 DIAGNOSIS — K2970 Gastritis, unspecified, without bleeding: Secondary | ICD-10-CM | POA: Diagnosis not present

## 2018-05-13 DIAGNOSIS — J309 Allergic rhinitis, unspecified: Secondary | ICD-10-CM | POA: Diagnosis not present

## 2018-05-13 DIAGNOSIS — Z6830 Body mass index (BMI) 30.0-30.9, adult: Secondary | ICD-10-CM | POA: Diagnosis not present

## 2018-05-13 DIAGNOSIS — M1990 Unspecified osteoarthritis, unspecified site: Secondary | ICD-10-CM | POA: Diagnosis not present

## 2018-05-13 DIAGNOSIS — K219 Gastro-esophageal reflux disease without esophagitis: Secondary | ICD-10-CM | POA: Diagnosis not present

## 2018-05-13 DIAGNOSIS — R197 Diarrhea, unspecified: Secondary | ICD-10-CM | POA: Diagnosis not present

## 2018-05-21 DIAGNOSIS — R69 Illness, unspecified: Secondary | ICD-10-CM | POA: Diagnosis not present

## 2018-05-30 ENCOUNTER — Other Ambulatory Visit: Payer: Self-pay | Admitting: Family Medicine

## 2018-05-30 NOTE — Telephone Encounter (Signed)
Looks like I gave her a printed RX with 5 refills. She either needs to call pharmacy for refills or find the rx?

## 2018-05-30 NOTE — Telephone Encounter (Signed)
Last OV: 05/07/18 Last Fill: 05/07/18 #60 no refills

## 2018-06-18 ENCOUNTER — Ambulatory Visit: Payer: Medicare HMO

## 2018-06-18 ENCOUNTER — Other Ambulatory Visit: Payer: Medicare HMO

## 2018-06-28 DIAGNOSIS — M2550 Pain in unspecified joint: Secondary | ICD-10-CM | POA: Diagnosis not present

## 2018-06-28 DIAGNOSIS — M797 Fibromyalgia: Secondary | ICD-10-CM | POA: Diagnosis not present

## 2018-06-28 DIAGNOSIS — S61209A Unspecified open wound of unspecified finger without damage to nail, initial encounter: Secondary | ICD-10-CM | POA: Diagnosis not present

## 2018-08-13 ENCOUNTER — Other Ambulatory Visit: Payer: Self-pay | Admitting: *Deleted

## 2018-08-13 MED ORDER — LEVOTHYROXINE SODIUM 25 MCG PO TABS: 25.0000 ug | ORAL_TABLET | Freq: Every day | ORAL | 1 refills | Status: DC

## 2018-08-22 ENCOUNTER — Ambulatory Visit: Payer: Medicare HMO

## 2018-08-22 ENCOUNTER — Other Ambulatory Visit: Payer: Medicare HMO

## 2018-10-16 ENCOUNTER — Ambulatory Visit
Admission: RE | Admit: 2018-10-16 | Discharge: 2018-10-16 | Disposition: A | Discharge status: Home / Self Care | Payer: Medicare HMO | Source: Ambulatory Visit | Attending: Family Medicine | Admitting: Family Medicine

## 2018-10-16 ENCOUNTER — Other Ambulatory Visit: Payer: Self-pay

## 2018-10-16 DIAGNOSIS — Z780 Asymptomatic menopausal state: Secondary | ICD-10-CM | POA: Diagnosis not present

## 2018-10-16 DIAGNOSIS — Z1231 Encounter for screening mammogram for malignant neoplasm of breast: Secondary | ICD-10-CM | POA: Diagnosis not present

## 2018-10-16 DIAGNOSIS — E2839 Other primary ovarian failure: Secondary | ICD-10-CM

## 2018-10-16 DIAGNOSIS — Z1239 Encounter for other screening for malignant neoplasm of breast: Secondary | ICD-10-CM

## 2018-10-16 DIAGNOSIS — M8589 Other specified disorders of bone density and structure, multiple sites: Secondary | ICD-10-CM | POA: Diagnosis not present

## 2018-10-16 NOTE — Progress Notes (Signed)
Please call patient: let her know that her bone density screen is stable showing mild low bone mass. Continue exercise, calcium and vit D. Can recheck in 2-3 years. Mammo was also normal. Thanks.

## 2018-11-22 ENCOUNTER — Other Ambulatory Visit: Payer: Self-pay | Admitting: *Deleted

## 2018-11-22 ENCOUNTER — Telehealth: Payer: Self-pay | Admitting: Family Medicine

## 2018-11-22 NOTE — Telephone Encounter (Signed)
Copied from [place] [id number]. Topic: Quick Communication - Rx Refill/Question >> Nov 22, 2018  1:07 PM Nils Flack wrote: Medication: clonazePAM (KLONOPIN) 1 MG tablet  Has the patient contacted their pharmacy? Yes.   (Agent: If no, request that the patient contact the pharmacy for the refill.) (Agent: If yes, when and what did the pharmacy advise?) says they have not heard back   Preferred Pharmacy (with phone number or street name): [facility]   Agent: Please be advised that RX refills may take up to 3 business days. We ask that you follow-up with your pharmacy.

## 2018-11-22 NOTE — Telephone Encounter (Signed)
See note

## 2018-11-24 ENCOUNTER — Other Ambulatory Visit: Payer: Self-pay | Admitting: Family Medicine

## 2018-11-25 MED ORDER — CLONAZEPAM 1 MG PO TABS: ORAL_TABLET | ORAL | 5 refills | Status: AC

## 2018-11-25 NOTE — Telephone Encounter (Signed)
Last OV: 05/07/18  Last Fill: 06/13/18

## 2019-02-11 ENCOUNTER — Other Ambulatory Visit: Payer: Self-pay | Admitting: Family Medicine

## 2019-05-12 ENCOUNTER — Encounter: Payer: Medicare HMO | Admitting: Family Medicine

## 2021-02-22 IMAGING — MG DIGITAL SCREENING BILATERAL MAMMOGRAM WITH CAD
4 series · 4 of 4 positions shown · non-contrast
Comparison: Previous exam(s).

CLINICAL DATA: Screening.

EXAM:
DIGITAL SCREENING BILATERAL MAMMOGRAM WITH CAD

[L MLO]
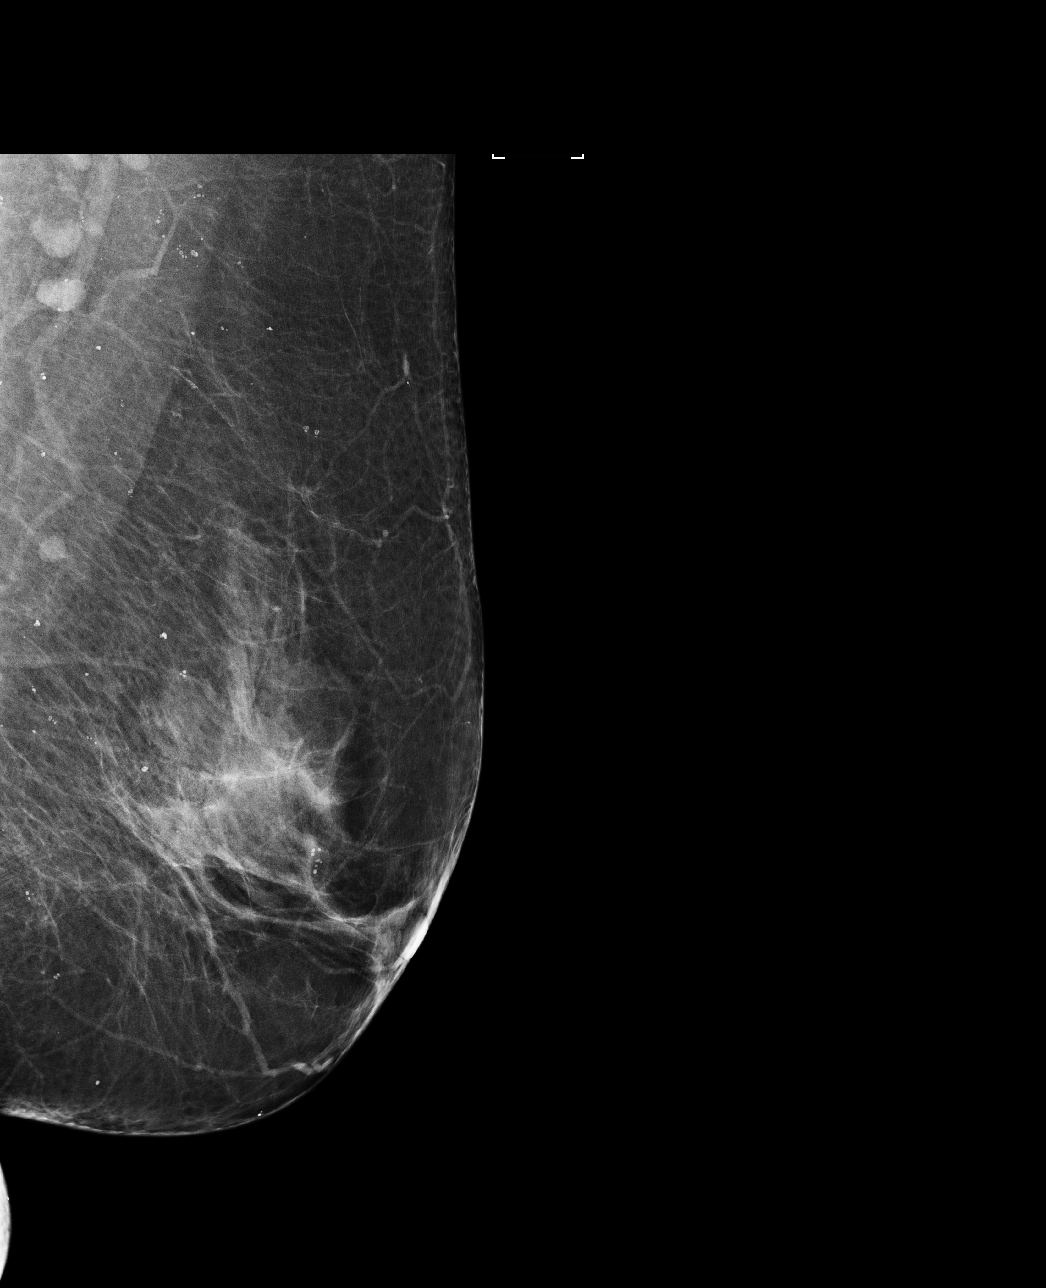

[R CC]
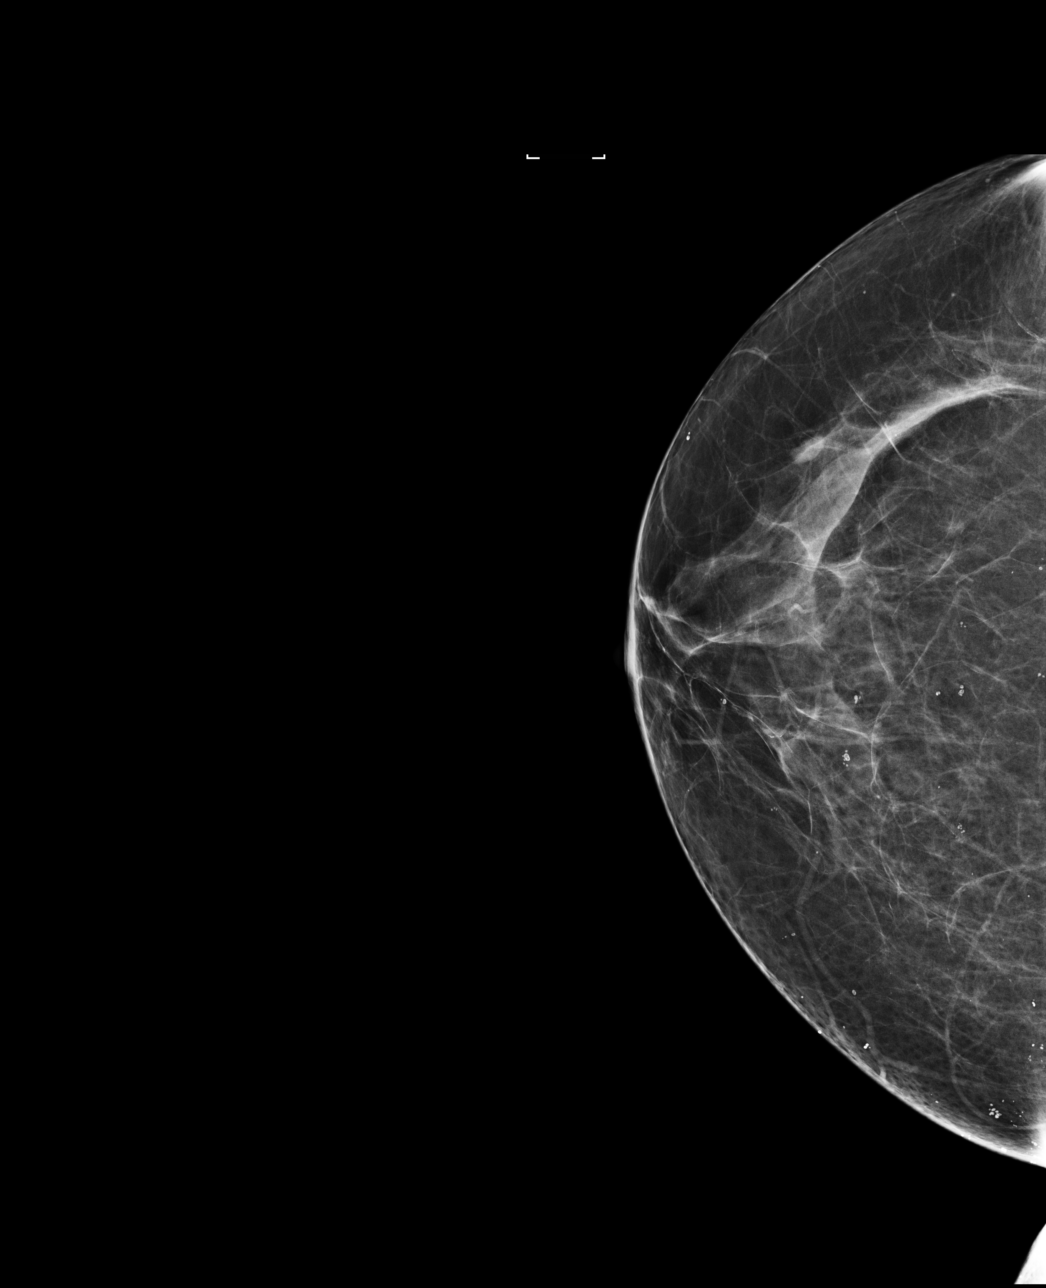

[R MLO]
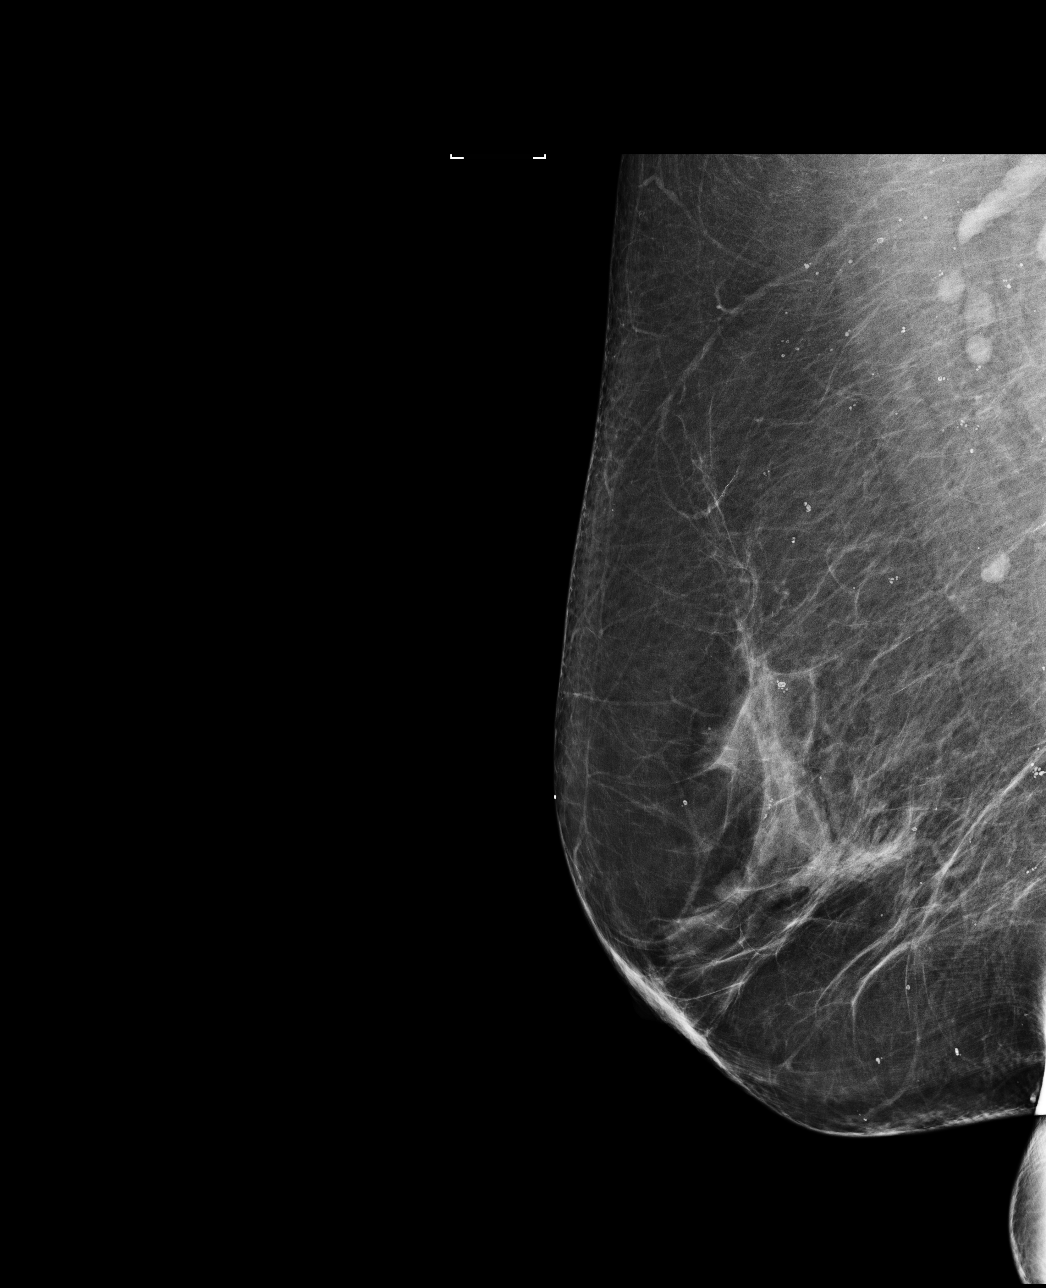

[L CC]
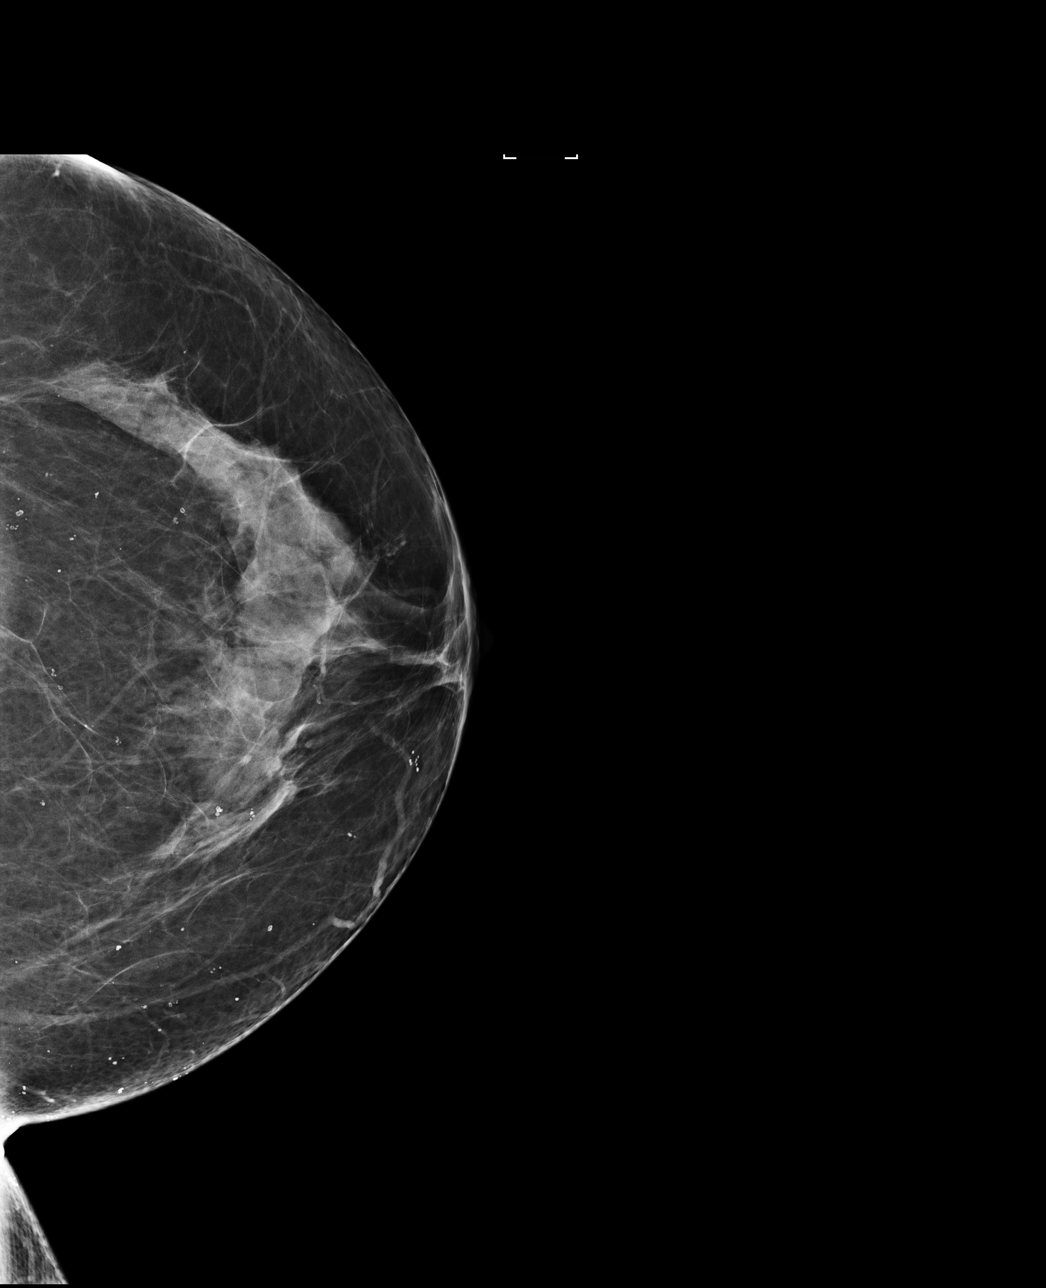

[4 of 4 positions shown; findings below may reference images not displayed]

ACR Breast Density Category c: The breast tissue is heterogeneously
dense, which may obscure small masses.
FINDINGS: There are no findings suspicious for malignancy. Images were
processed with CAD.
IMPRESSION: No mammographic evidence of malignancy. A result letter of this
screening mammogram will be mailed directly to the patient.

RECOMMENDATION:
Screening mammogram in one year. (Code:YJ-[id number])

BI-RADS CATEGORY  1: Negative.
